# Patient Record
Sex: Female | Born: 1960
Health system: Southern US, Community
[De-identification: ages and names within clinical notes are randomized; demographics above are authoritative.]

## PROBLEM LIST (undated history)

## (undated) DIAGNOSIS — F329 Major depressive disorder, single episode, unspecified: Secondary | ICD-10-CM

## (undated) DIAGNOSIS — F32A Depression, unspecified: Secondary | ICD-10-CM

## (undated) DIAGNOSIS — Z5189 Encounter for other specified aftercare: Secondary | ICD-10-CM

## (undated) DIAGNOSIS — F514 Sleep terrors [night terrors]: Secondary | ICD-10-CM

## (undated) DIAGNOSIS — R55 Syncope and collapse: Secondary | ICD-10-CM

## (undated) DIAGNOSIS — M5020 Other cervical disc displacement, unspecified cervical region: Secondary | ICD-10-CM

## (undated) DIAGNOSIS — R42 Dizziness and giddiness: Secondary | ICD-10-CM

## (undated) DIAGNOSIS — F419 Anxiety disorder, unspecified: Secondary | ICD-10-CM

## (undated) DIAGNOSIS — E119 Type 2 diabetes mellitus without complications: Secondary | ICD-10-CM

## (undated) DIAGNOSIS — K219 Gastro-esophageal reflux disease without esophagitis: Secondary | ICD-10-CM

## (undated) DIAGNOSIS — E559 Vitamin D deficiency, unspecified: Secondary | ICD-10-CM

## (undated) DIAGNOSIS — IMO0001 Reserved for inherently not codable concepts without codable children: Secondary | ICD-10-CM

## (undated) DIAGNOSIS — M1990 Unspecified osteoarthritis, unspecified site: Secondary | ICD-10-CM

## (undated) DIAGNOSIS — J45909 Unspecified asthma, uncomplicated: Secondary | ICD-10-CM

## (undated) DIAGNOSIS — G4730 Sleep apnea, unspecified: Secondary | ICD-10-CM

## (undated) DIAGNOSIS — I10 Essential (primary) hypertension: Secondary | ICD-10-CM

## (undated) DIAGNOSIS — I639 Cerebral infarction, unspecified: Secondary | ICD-10-CM

## (undated) DIAGNOSIS — D649 Anemia, unspecified: Secondary | ICD-10-CM

## (undated) DIAGNOSIS — I209 Angina pectoris, unspecified: Secondary | ICD-10-CM

## (undated) DIAGNOSIS — I509 Heart failure, unspecified: Secondary | ICD-10-CM

## (undated) HISTORY — DX: Major depressive disorder, single episode, unspecified: F32.9

## (undated) HISTORY — DX: Essential (primary) hypertension: I10

## (undated) HISTORY — PX: COLONOSCOPY: SHX174

## (undated) HISTORY — PX: MOUTH SURGERY: SHX715

## (undated) HISTORY — PX: TUBAL LIGATION: SHX77

## (undated) HISTORY — DX: Gastro-esophageal reflux disease without esophagitis: K21.9

## (undated) HISTORY — DX: Sleep apnea, unspecified: G47.30

## (undated) HISTORY — DX: Depression, unspecified: F32.A

## (undated) HISTORY — PX: FOOT SURGERY: SHX648

---

## 2005-04-01 HISTORY — PX: GASTRIC BYPASS: SHX52

## 2008-04-01 DIAGNOSIS — I639 Cerebral infarction, unspecified: Secondary | ICD-10-CM

## 2008-04-01 HISTORY — DX: Cerebral infarction, unspecified: I63.9

## 2012-05-17 ENCOUNTER — Encounter (HOSPITAL_COMMUNITY): Payer: Self-pay | Admitting: Emergency Medicine

## 2012-05-17 ENCOUNTER — Emergency Department (HOSPITAL_COMMUNITY): Payer: Self-pay

## 2012-05-17 ENCOUNTER — Emergency Department (HOSPITAL_COMMUNITY)
Admission: EM | Admit: 2012-05-17 | Discharge: 2012-05-17 | Disposition: A | Discharge status: Home / Self Care | Payer: Self-pay | Attending: Emergency Medicine | Admitting: Emergency Medicine

## 2012-05-17 DIAGNOSIS — Z87891 Personal history of nicotine dependence: Secondary | ICD-10-CM | POA: Insufficient documentation

## 2012-05-17 DIAGNOSIS — Z79899 Other long term (current) drug therapy: Secondary | ICD-10-CM | POA: Insufficient documentation

## 2012-05-17 DIAGNOSIS — R079 Chest pain, unspecified: Secondary | ICD-10-CM | POA: Insufficient documentation

## 2012-05-17 DIAGNOSIS — R142 Eructation: Secondary | ICD-10-CM | POA: Insufficient documentation

## 2012-05-17 DIAGNOSIS — Z9884 Bariatric surgery status: Secondary | ICD-10-CM | POA: Insufficient documentation

## 2012-05-17 DIAGNOSIS — J45909 Unspecified asthma, uncomplicated: Secondary | ICD-10-CM | POA: Insufficient documentation

## 2012-05-17 DIAGNOSIS — Z9889 Other specified postprocedural states: Secondary | ICD-10-CM | POA: Insufficient documentation

## 2012-05-17 DIAGNOSIS — Z8673 Personal history of transient ischemic attack (TIA), and cerebral infarction without residual deficits: Secondary | ICD-10-CM | POA: Insufficient documentation

## 2012-05-17 DIAGNOSIS — F411 Generalized anxiety disorder: Secondary | ICD-10-CM | POA: Insufficient documentation

## 2012-05-17 DIAGNOSIS — R143 Flatulence: Secondary | ICD-10-CM | POA: Insufficient documentation

## 2012-05-17 DIAGNOSIS — Z7982 Long term (current) use of aspirin: Secondary | ICD-10-CM | POA: Insufficient documentation

## 2012-05-17 DIAGNOSIS — R141 Gas pain: Secondary | ICD-10-CM | POA: Insufficient documentation

## 2012-05-17 HISTORY — DX: Encounter for other specified aftercare: Z51.89

## 2012-05-17 HISTORY — DX: Cerebral infarction, unspecified: I63.9

## 2012-05-17 HISTORY — DX: Unspecified asthma, uncomplicated: J45.909

## 2012-05-17 LAB — CBC WITH DIFFERENTIAL/PLATELET
Basophils Absolute: 0 10*3/uL (ref 0.0–0.1)
Basophils Relative: 0 % (ref 0–1)
Eosinophils Absolute: 0 10*3/uL (ref 0.0–0.7)
Eosinophils Relative: 0 % (ref 0–5)
HCT: 39.7 % (ref 36.0–46.0)
Hemoglobin: 13.5 g/dL (ref 12.0–15.0)
Lymphocytes Relative: 8 % — ABNORMAL LOW (ref 12–46)
Lymphs Abs: 0.6 10*3/uL — ABNORMAL LOW (ref 0.7–4.0)
MCH: 27.8 pg (ref 26.0–34.0)
MCHC: 34 g/dL (ref 30.0–36.0)
MCV: 81.9 fL (ref 78.0–100.0)
Monocytes Absolute: 0.6 10*3/uL (ref 0.1–1.0)
Monocytes Relative: 7 % (ref 3–12)
Neutro Abs: 6.6 10*3/uL (ref 1.7–7.7)
Neutrophils Relative %: 85 % — ABNORMAL HIGH (ref 43–77)
Platelets: 214 10*3/uL (ref 150–400)
RBC: 4.85 MIL/uL (ref 3.87–5.11)
RDW: 13.1 % (ref 11.5–15.5)
WBC: 7.8 10*3/uL (ref 4.0–10.5)

## 2012-05-17 LAB — POCT I-STAT, CHEM 8
BUN: 4 mg/dL — ABNORMAL LOW (ref 6–23)
Calcium, Ion: 1.24 mmol/L — ABNORMAL HIGH (ref 1.12–1.23)
Chloride: 109 mEq/L (ref 96–112)
Creatinine, Ser: 0.8 mg/dL (ref 0.50–1.10)
Glucose, Bld: 90 mg/dL (ref 70–99)
HCT: 42 % (ref 36.0–46.0)
Hemoglobin: 14.3 g/dL (ref 12.0–15.0)
Potassium: 3.9 mEq/L (ref 3.5–5.1)
Sodium: 143 mEq/L (ref 135–145)
TCO2: 25 mmol/L (ref 0–100)

## 2012-05-17 LAB — TROPONIN I
Troponin I: <0.3 ng/mL (ref <0.30)
Troponin I: <0.3 ng/mL (ref <0.30)

## 2012-05-17 NOTE — ED Notes (Addendum)
Patient states she ate a large fatty meal with ice cream before bed. Patient states she was gassy before bed. Patient woke up this morning feeling gassy and c/o chest tightness. Patient states she felt like she couldn't breathe, pain 10/10. Patient drank coffee trying to move the "gas bubble" around 0430. Patient just moved to the area. Patient placed on cardiac monitor. Patient requesting something for gas. Patient placed on 2L [place] O2 for comfort.

## 2012-05-17 NOTE — ED Notes (Signed)
Per EMS, patient called 911 initial complaint of chest pain. Upon evaluation by EMS patient area of complaint moved to her abd. Patient walked out of her house and collapsed in the driveway to her knees. Patient c/o shortness of breath with abd pain en route. Patient accompanied by minor child. Patient s/p gastric bypass in 2007, patient ate a large "fatty" meal and ice cream just before bed.

## 2012-05-17 NOTE — ED Provider Notes (Signed)
History     CSN: [id number]  Arrival date & time 05/17/12  2841   First MD Initiated Contact with Patient 05/17/12 0700      Chief Complaint  Patient presents with  . Abdominal Pain    (Consider location/radiation/quality/duration/timing/severity/associated sxs/prior treatment) The history is provided by the patient.   patient presents with chest pain began around 3 in the morning. It is dull and in her mid chest. It went to the left side. It is improved now. No fevers. No cough. She states that her mother recently died and she is not able to deal with the. She states her mother died of a heart attack. Patient is pain-free now. She states that she had a negative heart cath in 2006. The pain is up in her chest but did briefly moved down to her abdomen. She states the pain does go through to her back a little bit. No nausea vomiting. She has a previous gastric bypass. Patient states that she was worried and had to get out of the house because that's where her mother had died. Patient states that she has been belching and it makes her pain improve.  Past Medical History  Diagnosis Date  . Asthma   . Blood transfusion without reported diagnosis   . Stroke 2010    TIA    Past Surgical History  Procedure Laterality Date  . Gastric bypass  2007    History reviewed. No pertinent family history.  History  Substance Use Topics  . Smoking status: Former Games developer  . Smokeless tobacco: Not on file  . Alcohol Use: No     Comment: quit 90 days ago    OB History   Grav Para Term Preterm Abortions TAB SAB Ect Mult Living                  Review of Systems  Constitutional: Negative for activity change and appetite change.  HENT: Negative for neck stiffness.   Eyes: Negative for pain.  Respiratory: Negative for chest tightness and shortness of breath.   Cardiovascular: Positive for chest pain. Negative for leg swelling.  Gastrointestinal: Positive for abdominal pain. Negative for  nausea, vomiting and diarrhea.  Genitourinary: Negative for flank pain.  Musculoskeletal: Negative for back pain.  Skin: Negative for rash.  Neurological: Negative for weakness, numbness and headaches.  Psychiatric/Behavioral: Negative for behavioral problems. The patient is nervous/anxious.     Allergies  Review of patient's allergies indicates no known allergies.  Home Medications   Current Outpatient Rx  Name  Route  Sig  Dispense  Refill  . aspirin 81 MG chewable tablet   Oral   Chew 81 mg by mouth daily.         . Cyanocobalamin (VITAMIN B 12 PO)   Sublingual   Place 1,000 mg under the tongue daily.         Marland Kitchen docusate sodium (COLACE) 100 MG capsule   Oral   Take 100 mg by mouth 2 (two) times daily as needed for constipation. For constipation         . ferrous sulfate 325 (65 FE) MG tablet   Oral   Take 325 mg by mouth 3 (three) times daily with meals.         . folic acid (FOLVITE) 1 MG tablet   Oral   Take 1 mg by mouth daily.           BP 111/54  Pulse 71  Temp(Src) 98.7 F (  37.1 C) (Oral)  Resp 20  SpO2 97%  Physical Exam  Nursing note and vitals reviewed. Constitutional: She is oriented to person, place, and time. She appears well-developed and well-nourished.  HENT:  Head: Normocephalic and atraumatic.  Eyes: EOM are normal. Pupils are equal, round, and reactive to light.  Neck: Normal range of motion. Neck supple.  Cardiovascular: Normal rate, regular rhythm and normal heart sounds.   No murmur heard. Pulmonary/Chest: Effort normal and breath sounds normal. No respiratory distress. She has no wheezes. She has no rales.  Abdominal: Soft. Bowel sounds are normal. She exhibits no distension. There is no tenderness. There is no rebound and no guarding.  Musculoskeletal: Normal range of motion.  Neurological: She is alert and oriented to person, place, and time. No cranial nerve deficit.  Skin: Skin is warm and dry.  Psychiatric: Her speech is  normal.  patietn appears somewhat anxious.     ED Course  Procedures (including critical care time)  Labs Reviewed  CBC WITH DIFFERENTIAL - Abnormal; Notable for the following:    Neutrophils Relative 85 (*)    Lymphocytes Relative 8 (*)    Lymphs Abs 0.6 (*)    All other components within normal limits  POCT I-STAT, CHEM 8 - Abnormal; Notable for the following:    BUN 4 (*)    Calcium, Ion 1.24 (*)    All other components within normal limits  TROPONIN I  TROPONIN I   Dg Chest 2 View  05/17/2012  *RADIOLOGY REPORT*  Clinical Data: Chest pain and shortness of breath.  CHEST - 2 VIEW  Comparison: None  Findings: The cardiomediastinal silhouette is unremarkable. The lungs are clear. There is no evidence of focal airspace disease, pulmonary edema, suspicious pulmonary nodule/mass, pleural effusion, or pneumothorax. No acute bony abnormalities are identified.  IMPRESSION: No evidence of active cardiopulmonary disease.   Original Report Authenticated By: Harmon Pier, M.D.      1. Chest pain      Date: 05/17/2012  Rate: 77  Rhythm: normal sinus rhythm  QRS Axis: normal  Intervals: normal  ST/T Wave abnormalities: normal  Conduction Disutrbances:none  Narrative Interpretation:   Old EKG Reviewed: none available    MDM  Patient with chest pain. may be an anxiety component to. EKG is reassuring. Has had a negative recent heart cath. Pain-free now. No abdominal pain or tenderness. Patient be discharged home. She's had 2 negative cardiac enzymes.        Juliet Rude. Rubin Payor, MD 05/17/12 1536

## 2012-05-17 NOTE — ED Notes (Signed)
[id number]> Expected date:<BR> Expected time:<BR> Means of arrival:<BR> Comments:<BR> EMS, 55 F, Abd Pain

## 2012-05-17 NOTE — ED Notes (Signed)
Pt. Blood drawn was attempted x2 but was unsuccessful. Nurse was notified of pt. labs.

## 2012-05-17 NOTE — ED Notes (Signed)
Pt given sandwich and water. Sts she put her bra and shirt back on over the monitor leads. Sts she is ready to go. Informed pt that she had to wait for last lab result then dr would talk to her about d/c.

## 2013-12-07 ENCOUNTER — Encounter (HOSPITAL_COMMUNITY): Payer: Self-pay | Admitting: Emergency Medicine

## 2013-12-07 ENCOUNTER — Emergency Department (HOSPITAL_COMMUNITY)
Admission: EM | Admit: 2013-12-07 | Discharge: 2013-12-07 | Disposition: A | Discharge status: Home / Self Care | Payer: Medicaid Other | Attending: Emergency Medicine | Admitting: Emergency Medicine

## 2013-12-07 DIAGNOSIS — Z9884 Bariatric surgery status: Secondary | ICD-10-CM | POA: Insufficient documentation

## 2013-12-07 DIAGNOSIS — R079 Chest pain, unspecified: Secondary | ICD-10-CM | POA: Insufficient documentation

## 2013-12-07 DIAGNOSIS — E119 Type 2 diabetes mellitus without complications: Secondary | ICD-10-CM | POA: Insufficient documentation

## 2013-12-07 DIAGNOSIS — Z8673 Personal history of transient ischemic attack (TIA), and cerebral infarction without residual deficits: Secondary | ICD-10-CM | POA: Insufficient documentation

## 2013-12-07 DIAGNOSIS — R5381 Other malaise: Secondary | ICD-10-CM

## 2013-12-07 DIAGNOSIS — R5383 Other fatigue: Secondary | ICD-10-CM | POA: Insufficient documentation

## 2013-12-07 DIAGNOSIS — Z87891 Personal history of nicotine dependence: Secondary | ICD-10-CM | POA: Diagnosis not present

## 2013-12-07 DIAGNOSIS — F10229 Alcohol dependence with intoxication, unspecified: Secondary | ICD-10-CM | POA: Insufficient documentation

## 2013-12-07 DIAGNOSIS — R1013 Epigastric pain: Secondary | ICD-10-CM | POA: Insufficient documentation

## 2013-12-07 DIAGNOSIS — J45901 Unspecified asthma with (acute) exacerbation: Secondary | ICD-10-CM | POA: Diagnosis not present

## 2013-12-07 DIAGNOSIS — F1020 Alcohol dependence, uncomplicated: Secondary | ICD-10-CM

## 2013-12-07 HISTORY — DX: Type 2 diabetes mellitus without complications: E11.9

## 2013-12-07 LAB — CBC WITH DIFFERENTIAL/PLATELET
Basophils Absolute: 0 10*3/uL (ref 0.0–0.1)
Basophils Relative: 0 % (ref 0–1)
Eosinophils Absolute: 0 10*3/uL (ref 0.0–0.7)
Eosinophils Relative: 1 % (ref 0–5)
HCT: 41 % (ref 36.0–46.0)
Hemoglobin: 14 g/dL (ref 12.0–15.0)
Lymphocytes Relative: 22 % (ref 12–46)
Lymphs Abs: 1 10*3/uL (ref 0.7–4.0)
MCH: 27.5 pg (ref 26.0–34.0)
MCHC: 34.1 g/dL (ref 30.0–36.0)
MCV: 80.4 fL (ref 78.0–100.0)
Monocytes Absolute: 0.2 10*3/uL (ref 0.1–1.0)
Monocytes Relative: 5 % (ref 3–12)
Neutro Abs: 3.4 10*3/uL (ref 1.7–7.7)
Neutrophils Relative %: 72 % (ref 43–77)
Platelets: 164 10*3/uL (ref 150–400)
RBC: 5.1 MIL/uL (ref 3.87–5.11)
RDW: 14.2 % (ref 11.5–15.5)
WBC: 4.6 10*3/uL (ref 4.0–10.5)

## 2013-12-07 LAB — RAPID URINE DRUG SCREEN, HOSP PERFORMED
Amphetamines: NOT DETECTED
Barbiturates: NOT DETECTED
Benzodiazepines: NOT DETECTED
Cocaine: NOT DETECTED
Opiates: NOT DETECTED
Tetrahydrocannabinol: NOT DETECTED

## 2013-12-07 LAB — URINALYSIS, ROUTINE W REFLEX MICROSCOPIC
Glucose, UA: NEGATIVE mg/dL
Hgb urine dipstick: NEGATIVE
Ketones, ur: 40 mg/dL — AB
Nitrite: NEGATIVE
Protein, ur: NEGATIVE mg/dL
Specific Gravity, Urine: 1.015 (ref 1.005–1.030)
Urobilinogen, UA: 1 mg/dL (ref 0.0–1.0)
pH: 6 (ref 5.0–8.0)

## 2013-12-07 LAB — COMPREHENSIVE METABOLIC PANEL
ALT: 13 U/L (ref 0–35)
AST: 22 U/L (ref 0–37)
Albumin: 3.8 g/dL (ref 3.5–5.2)
Alkaline Phosphatase: 82 U/L (ref 39–117)
Anion gap: 14 (ref 5–15)
BUN: 7 mg/dL (ref 6–23)
CO2: 24 mEq/L (ref 19–32)
Calcium: 9.3 mg/dL (ref 8.4–10.5)
Chloride: 99 mEq/L (ref 96–112)
Creatinine, Ser: 0.66 mg/dL (ref 0.50–1.10)
GFR calc Af Amer: >90 mL/min (ref >90)
GFR calc non Af Amer: >90 mL/min (ref >90)
Glucose, Bld: 102 mg/dL — ABNORMAL HIGH (ref 70–99)
Potassium: 4.4 mEq/L (ref 3.7–5.3)
Sodium: 137 mEq/L (ref 137–147)
Total Bilirubin: 1.1 mg/dL (ref 0.3–1.2)
Total Protein: 7.3 g/dL (ref 6.0–8.3)

## 2013-12-07 LAB — ETHANOL: Alcohol, Ethyl (B): <11 mg/dL (ref 0–11)

## 2013-12-07 LAB — URINE MICROSCOPIC-ADD ON

## 2013-12-07 LAB — LIPASE, BLOOD: Lipase: 40 U/L (ref 11–59)

## 2013-12-07 MED ORDER — SODIUM CHLORIDE 0.9 % IV SOLN
INTRAVENOUS | Status: DC
Start: 2013-12-07 — End: 2013-12-07
  Administered 2013-12-07: 09:00:00 via INTRAVENOUS

## 2013-12-07 NOTE — ED Notes (Signed)
Dr. Wentz at bedside. 

## 2013-12-07 NOTE — ED Notes (Signed)
Cp and sob started yesterday  Has had some nausea states had gastric bypass in 07 and that it took care of her diabetes states she has not followed up has been drinking a lot of etoh she states

## 2013-12-07 NOTE — ED Notes (Signed)
Case Manager at bedside

## 2013-12-07 NOTE — ED Notes (Signed)
Phlebotomy notified of a redraw of the CBC with differential due to clotting in the main lab.

## 2013-12-07 NOTE — ED Notes (Signed)
Social worker to speak to pt prior to discharge.

## 2013-12-07 NOTE — Discharge Instructions (Signed)
Avoid all forms of alcohol. Followup for treatment and counseling with the facility or organization that can help you with alcohol abuse. Try to get plenty of rest, eat and drink regularly. Consider following up with a primary care doctor for further evaluation and treatment within 2 or 3 weeks.     Alcohol Use Disorder Alcohol use disorder is a mental disorder. It is not a one-time incident of heavy drinking. Alcohol use disorder is the excessive and uncontrollable use of alcohol over time that leads to problems with functioning in one or more areas of daily living. People with this disorder risk harming themselves and others when they drink to excess. Alcohol use disorder also can cause other mental disorders, such as mood and anxiety disorders, and serious physical problems. People with alcohol use disorder often misuse other drugs.  Alcohol use disorder is common and widespread. Some people with this disorder drink alcohol to cope with or escape from negative life events. Others drink to relieve chronic pain or symptoms of mental illness. People with a family history of alcohol use disorder are at higher risk of losing control and using alcohol to excess.  SYMPTOMS  Signs and symptoms of alcohol use disorder may include the following:   Consumption ofalcohol inlarger amounts or over a longer period of time than intended.  Multiple unsuccessful attempts to cutdown or control alcohol use.   A great deal of time spent obtaining alcohol, using alcohol, or recovering from the effects of alcohol (hangover).  A strong desire or urge to use alcohol (cravings).   Continued use of alcohol despite problems at work, school, or home because of alcohol use.   Continued use of alcohol despite problems in relationships because of alcohol use.  Continued use of alcohol in situations when it is physically hazardous, such as driving a car.  Continued use of alcohol despite awareness of a  physical or psychological problem that is likely related to alcohol use. Physical problems related to alcohol use can involve the brain, heart, liver, stomach, and intestines. Psychological problems related to alcohol use include intoxication, depression, anxiety, psychosis, delirium, and dementia.   The need for increased amounts of alcohol to achieve the same desired effect, or a decreased effect from the consumption of the same amount of alcohol (tolerance).  Withdrawal symptoms upon reducing or stopping alcohol use, or alcohol use to reduce or avoid withdrawal symptoms. Withdrawal symptoms include:  Racing heart.  Hand tremor.  Difficulty sleeping.  Nausea.  Vomiting.  Hallucinations.  Restlessness.  Seizures. DIAGNOSIS Alcohol use disorder is diagnosed through an assessment by your health care provider. Your health care provider may start by asking three or four questions to screen for excessive or problematic alcohol use. To confirm a diagnosis of alcohol use disorder, at least two symptoms must be present within a 41-month period. The severity of alcohol use disorder depends on the number of symptoms:  Mild--two or three.  Moderate--four or five.  Severe--six or more. Your health care provider may perform a physical exam or use results from lab tests to see if you have physical problems resulting from alcohol use. Your health care provider may refer you to a mental health professional for evaluation. TREATMENT  Some people with alcohol use disorder are able to reduce their alcohol use to low-risk levels. Some people with alcohol use disorder need to quit drinking alcohol. When necessary, mental health professionals with specialized training in substance use treatment can help. Your health care provider can  help you decide how severe your alcohol use disorder is and what type of treatment you need. The following forms of treatment are available:   Detoxification.  Detoxification involves the use of prescription medicines to prevent alcohol withdrawal symptoms in the first week after quitting. This is important for people with a history of symptoms of withdrawal and for heavy drinkers who are likely to have withdrawal symptoms. Alcohol withdrawal can be dangerous and, in severe cases, cause death. Detoxification is usually provided in a hospital or in-patient substance use treatment facility.  Counseling or talk therapy. Talk therapy is provided by substance use treatment counselors. It addresses the reasons people use alcohol and ways to keep them from drinking again. The goals of talk therapy are to help people with alcohol use disorder find healthy activities and ways to cope with life stress, to identify and avoid triggers for alcohol use, and to handle cravings, which can cause relapse.  Medicines.Different medicines can help treat alcohol use disorder through the following actions:  Decrease alcohol cravings.  Decrease the positive reward response felt from alcohol use.  Produce an uncomfortable physical reaction when alcohol is used (aversion therapy).  Support groups. Support groups are run by people who have quit drinking. They provide emotional support, advice, and guidance. These forms of treatment are often combined. Some people with alcohol use disorder benefit from intensive combination treatment provided by specialized substance use treatment centers. Both inpatient and outpatient treatment programs are available. Document Released: 04/25/2004 Document Revised: 08/02/2013 Document Reviewed: 06/25/2012 [facility] Patient Information 2015 [place], [place]. This information is not intended to replace advice given to you by your health care provider. Make sure you discuss any questions you have with your health care provider.  Alcohol and Nutrition Nutrition serves two purposes. It provides energy. It also maintains body structure and function. Food  supplies energy. It also provides the building blocks needed to replace worn or damaged cells. Alcoholics often eat poorly. This limits their supply of essential nutrients. This affects energy supply and structure maintenance. Alcohol also affects the body's nutrients in:  Digestion.  Storage.  Using and getting rid of waste products. IMPAIRMENT OF NUTRIENT DIGESTION AND UTILIZATION   Once ingested, food must be broken down into small components (digested). Then it is available for energy. It helps maintain body structure and function. Digestion begins in the mouth. It continues in the stomach and intestines, with help from the pancreas. The nutrients from digested food are absorbed from the intestines into the blood. Then they are carried to the liver. The liver prepares nutrients for:  Immediate use.  Storage and future use.  Alcohol inhibits the breakdown of nutrients into usable molecules.  It decreases secretion of digestive enzymes from the pancreas.  Alcohol impairs nutrient absorption by damaging the cells lining the stomach and intestines.  It also interferes with moving some nutrients into the blood.  In addition, nutritional deficiencies themselves may lead to further absorption problems.  For example, folate deficiency changes the cells that line the small intestine. This impairs how water is absorbed. It also affects absorbed nutrients. These include glucose, sodium, and additional folate.  Even if nutrients are digested and absorbed, alcohol can prevent them from being fully used. It changes their transport, storage, and excretion. Impaired utilization of nutrients by alcoholics is indicated by:  Decreased liver stores of vitamins, such as vitamin A.  Increased excretion of nutrients such as fat. ALCOHOL AND ENERGY SUPPLY   Three basic nutritional components found  in food are:  Carbohydrates.  Proteins.  Fats.  These are used as energy. Some alcoholics take in  as much as 50% of their total daily calories from alcohol. They often neglect important foods.  Even when enough food is eaten, alcohol can impair the ways the body controls blood sugar (glucose) levels. It may either increase or decrease blood sugar.  In non-diabetic alcoholics, increased blood sugar (hyperglycemia) is caused by poor insulin secretion. It is usually temporary.  Decreased blood sugar (hypoglycemia) can cause serious injury even if this condition is short-lived. Low blood sugar can happen when a fasting or malnourished person drinks alcohol. When there is no food to supply energy, stored sugar is used up. The products of alcohol inhibit forming glucose from other compounds such as amino acids. As a result, alcohol causes the brain and other body tissue to lack glucose. It is needed for energy and function.  Alcohol is an energy source. But how the body processes and uses the energy from alcohol is complex. Also, when alcohol is substituted for carbohydrates, subjects tend to lose weight. This indicates that they get less energy from alcohol than from food. ALCOHOL - MAINTAINING CELL STRUCTURE AND FUNCTION  Structure Cells are made mostly of protein. So an adequate protein diet is important for maintaining cell structure. This is especially true if cells are being damaged. Research indicates that alcohol affects protein nutrition by causing impaired:  Digestion of proteins to amino acids.  Processing of amino acids by the small intestine and liver.  Synthesis of proteins from amino acids.  Protein secretion by the liver. Function Nutrients are essential for the body to function well. They provide the tools that the body needs to work well:   Proteins.  Vitamins.  Minerals. Alcohol can disrupt body function. It may cause nutrient deficiencies. And it may interfere with the way nutrients are processed. Vitamins  Vitamins are essential to maintain growth and normal  metabolism. They regulate many of the body`s processes. Chronic heavy drinking causes deficiencies in many vitamins. This is caused by eating less. And, in some cases, vitamins may be poorly absorbed. For example, alcohol inhibits fat absorption. It impairs how the vitamins A, E, and D are normally absorbed along with dietary fats. Not enough vitamin A may cause night blindness. Not enough vitamin D may cause softening of the bones.  Some alcoholics lack vitamins A, C, D, E, K, and the B vitamins. These are all involved in wound healing and cell maintenance. In particular, because vitamin K is necessary for blood clotting, lacking that vitamin can cause delayed clotting. The result is excess bleeding. Lacking other vitamins involved in brain function may cause severe neurological damage. Minerals Deficiencies of minerals such as calcium, magnesium, iron, and zinc are common in alcoholics. The alcohol itself does not seem to affect how these minerals are absorbed. Rather, they seem to occur secondary to other alcohol-related problems, such as:  Less calcium absorbed.  Not enough magnesium.  More urinary excretion.  Vomiting.  Diarrhea.  Not enough iron due to gastrointestinal bleeding.  Not enough zinc or losses related to other nutrient deficiencies.  Mineral deficiencies can cause a variety of medical consequences. These range from calcium-related bone disease to zinc-related night blindness and skin lesions. ALCOHOL, MALNUTRITION, AND MEDICAL COMPLICATIONS  Liver Disease   Alcoholic liver damage is caused primarily by alcohol itself. But poor nutrition may increase the risk of alcohol-related liver damage. For example, nutrients normally found in the liver  are known to be affected by drinking alcohol. These include carotenoids, which are the major sources of vitamin A, and vitamin E compounds. Decreases in such nutrients may play some role in alcohol-related liver  damage. Pancreatitis  Research suggests that malnutrition may increase the risk of developing alcoholic pancreatitis. Research suggests that a diet lacking in protein may increase alcohol's damaging effect on the pancreas. Brain  Nutritional deficiencies may have severe effects on brain function. These may be permanent. Specifically, thiamine deficiencies are often seen in alcoholics. They can cause severe neurological problems. These include:  Impaired movement.  Memory loss seen in Wernicke-Korsakoff syndrome. Pregnancy  Alcohol has toxic effects on fetal development. It causes alcohol-related birth defects. They include fetal alcohol syndrome. Alcohol itself is toxic to the fetus. Also, the nutritional deficiency can affect how the fetus develops. That may compound the risk of developmental damage.  Nutritional needs during pregnancy are 10% to 30% greater than normal. Food intake can increase by as much as 140% to cover the needs of both mother and fetus. An alcoholic mother`s nutritional problems may adversely affect the nutrition of the fetus. And alcohol itself can also restrict nutrition flow to the fetus. NUTRITIONAL STATUS OF ALCOHOLICS  Techniques for assessing nutritional status include:  Taking body measurements to estimate fat reserves. They include:  Weight.  Height.  Mass.  Skin fold thickness.  Performing blood analysis to provide measurements of circulating:  Proteins.  Vitamins.  Minerals.  These techniques tend to be imprecise. For many nutrients, there is no clear "cut-off" point that would allow an accurate definition of deficiency. So assessing the nutritional status of alcoholics is limited by these techniques. Dietary status may provide information about the risk of developing nutritional problems. Dietary status is assessed by:  Taking patients' dietary histories.  Evaluating the amount and types of food they are eating.  It is difficult to  determine what exact amount of alcohol begins to have damaging effects on nutrition. In general, moderate drinkers have 2 drinks or less per day. They seem to be at little risk for nutritional problems. Various medical disorders begin to appear at greater levels.  Research indicates that the majority of even the heaviest drinkers have few obvious nutritional deficiencies. Many alcoholics who are hospitalized for medical complications of their disease do have severe malnutrition. Alcoholics tend to eat poorly. Often they eat less than the amounts of food necessary to provide enough:  Carbohydrates.  Protein.  Fat.  Vitamins A and C.  B vitamins.  Minerals like calcium and iron. Of major concern is alcohol's effect on digesting food and use of nutrients. It may shift a mildly malnourished person toward severe malnutrition. Document Released: 01/10/2005 Document Revised: 06/10/2011 Document Reviewed: 06/26/2005 [facility] Patient Information 2015 [place], [place]. This information is not intended to replace advice given to you by your health care provider. Make sure you discuss any questions you have with your health care provider.  Fatigue Fatigue is a feeling of tiredness, lack of energy, lack of motivation, or feeling tired all the time. Having enough rest, good nutrition, and reducing stress will normally reduce fatigue. Consult your caregiver if it persists. The nature of your fatigue will help your caregiver to find out its cause. The treatment is based on the cause.  CAUSES  There are many causes for fatigue. Most of the time, fatigue can be traced to one or more of your habits or routines. Most causes fit into one or more of three general  areas. They are: Lifestyle problems  Sleep disturbances.  Overwork.  Physical exertion.  Unhealthy habits.  Poor eating habits or eating disorders.  Alcohol and/or drug use .  Lack of proper nutrition (malnutrition). Psychological  problems  Stress and/or anxiety problems.  Depression.  Grief.  Boredom. Medical Problems or Conditions  Anemia.  Pregnancy.  Thyroid gland problems.  Recovery from major surgery.  Continuous pain.  Emphysema or asthma that is not well controlled  Allergic conditions.  Diabetes.  Infections (such as mononucleosis).  Obesity.  Sleep disorders, such as sleep apnea.  Heart failure or other heart-related problems.  Cancer.  Kidney disease.  Liver disease.  Effects of certain medicines such as antihistamines, cough and cold remedies, prescription pain medicines, heart and blood pressure medicines, drugs used for treatment of cancer, and some antidepressants. SYMPTOMS  The symptoms of fatigue include:   Lack of energy.  Lack of drive (motivation).  Drowsiness.  Feeling of indifference to the surroundings. DIAGNOSIS  The details of how you feel help guide your caregiver in finding out what is causing the fatigue. You will be asked about your present and past health condition. It is important to review all medicines that you take, including prescription and non-prescription items. A thorough exam will be done. You will be questioned about your feelings, habits, and normal lifestyle. Your caregiver may suggest blood tests, urine tests, or other tests to look for common medical causes of fatigue.  TREATMENT  Fatigue is treated by correcting the underlying cause. For example, if you have continuous pain or depression, treating these causes will improve how you feel. Similarly, adjusting the dose of certain medicines will help in reducing fatigue.  HOME CARE INSTRUCTIONS   Try to get the required amount of good sleep every night.  Eat a healthy and nutritious diet, and drink enough water throughout the day.  Practice ways of relaxing (including yoga or meditation).  Exercise regularly.  Make plans to change situations that cause stress. Act on those plans so that  stresses decrease over time. Keep your work and personal routine reasonable.  Avoid street drugs and minimize use of alcohol.  Start taking a daily multivitamin after consulting your caregiver. SEEK MEDICAL CARE IF:   You have persistent tiredness, which cannot be accounted for.  You have fever.  You have unintentional weight loss.  You have headaches.  You have disturbed sleep throughout the night.  You are feeling sad.  You have constipation.  You have dry skin.  You have gained weight.  You are taking any new or different medicines that you suspect are causing fatigue.  You are unable to sleep at night.  You develop any unusual swelling of your legs or other parts of your body. SEEK IMMEDIATE MEDICAL CARE IF:   You are feeling confused.  Your vision is blurred.  You feel faint or pass out.  You develop severe headache.  You develop severe abdominal, pelvic, or back pain.  You develop chest pain, shortness of breath, or an irregular or fast heartbeat.  You are unable to pass a normal amount of urine.  You develop abnormal bleeding such as bleeding from the rectum or you vomit blood.  You have thoughts about harming yourself or committing suicide.  You are worried that you might harm someone else. MAKE SURE YOU:   Understand these instructions.  Will watch your condition.  Will get help right away if you are not doing well or get worse. Document Released: 01/13/2007  Document Revised: 06/10/2011 Document Reviewed: 07/20/2013 [facility] Patient Information 2015 [place], [place]. This information is not intended to replace advice given to you by your health care provider. Make sure you discuss any questions you have with your health care provider.    Emergency Department Resource Guide 1) Find a Doctor and Pay Out of Pocket Although you won't have to find out who is covered by your insurance plan, it is a good idea to ask around and get recommendations. You  will then need to call the office and see if the doctor you have chosen will accept you as a new patient and what types of options they offer for patients who are self-pay. Some doctors offer discounts or will set up payment plans for their patients who do not have insurance, but you will need to ask so you aren't surprised when you get to your appointment.  2) Contact Your Local Health Department Not all health departments have doctors that can see patients for sick visits, but many do, so it is worth a call to see if yours does. If you don't know where your local health department is, you can check in your phone book. The CDC also has a tool to help you locate your state's health department, and many state websites also have listings of all of their local health departments.  3) Find a Walk-in Clinic If your illness is not likely to be very severe or complicated, you may want to try a walk in clinic. These are popping up all over the country in pharmacies, drugstores, and shopping centers. They're usually staffed by nurse practitioners or physician assistants that have been trained to treat common illnesses and complaints. They're usually fairly quick and inexpensive. However, if you have serious medical issues or chronic medical problems, these are probably not your best option.  No Primary Care Doctor: - Call [facility] at  [phone] - they can help you locate a primary care doctor that  accepts your insurance, provides certain services, etc. - Physician Referral Service- [phone]  Chronic Pain Problems: Organization         Address  Phone   Notes  [facility]  [phone] Patients need to be referred by their primary care doctor.   Medication Assistance: Organization         Address  Phone   Notes  [facility] Medication [facility] [address] [place] [place], [place] [address] [phone] --Must be a resident of [facility] -- Must  have NO insurance coverage whatsoever (no Medicaid/ Medicare, etc.) -- The pt. MUST have a primary care doctor that directs their care regularly and follows them in the community   [facility]  [phone]   [employer]  [phone]    Agencies that provide inexpensive medical care: Organization         Address  Phone   Notes  [facility]  [phone]   [facility]    [phone]   [facility] [address] [place], [place] [address] [phone]   [facility] [address] [place] [phone]   [facility]    [phone]   [facility]    [phone]   [facility]  [address], [place] Phone:  [phone], Fax:  [phone] Hours of Operation:  9 am - 6 pm, M-F.  Also  accepts Medicaid/Medicare and self-pay.  [facility] for [place] [address], [address], [place] Phone: [phone], Fax: [phone]. Hours of Operation:  8:30 am - 5:30 pm, M-F.  Also accepts Medicaid and self-pay.  [facility] [place] [address], [place] Phone: [phone]   [place], [place], [place] [phone], Ext. [phone] Mondays & Thursdays: 7-9 AM.  First 15 patients are seen on a first come, first serve basis.    [place] Providers:  Organization         Address  Phone   Notes  [facility] [address], [place] [phone] Also accepts self-pay patients.  [facility] [address], [place]  [phone]   [place], [address], [place] [phone]   [facility] [address] [place] [phone]   Lucianne Lei [address], [place]   [phone] Only accepts [place] [facility] [place] patients after  they have their name applied to their card.   Self-Pay (no insurance) in [facility]:  Organization         Address  Phone   Notes  Sickle Cell Patients, [facility] [place] [phone]   [facility] [place] [phone]   [facility] [place]  [place], [place], [place] [phone]   [facility]/Dr. Osei-Bonsu  [address] [place] or [address], [place] [phone] Phone number for both [place] and [place] locations is the same.  [facility] [address], [place] [phone]   [facility] [address] [place] or [address] [phone]   [facility] [address], [place] [phone], phone; [phone], fax Sees patients 1st and 3rd Saturday of every month.  Must not qualify for public or private insurance (i.e. Medicaid, Medicare, [place] [facility] Choice, Veterans' Benefits)  Household income should be no more than 200% of the poverty level The clinic cannot treat you if you are pregnant or think you are pregnant  Sexually transmitted diseases are not treated at the clinic.    Dental Care: Organization         Address  Phone  Notes  [facility] Department of [facility] [phone] Accepts children up to age 48 who are enrolled in [place] or [place]; pregnant women with a Medicaid card; and children who have applied for Medicaid or [place] Health Choice, but were declined, whose parents can pay a reduced fee at time of service.  [facility] Department of [facility]  [address], [place] [phone] Accepts children up to age 7 who are enrolled in [place] or [place]; pregnant women with a Medicaid card; and children who  have applied for Medicaid or [place] Health Choice, but were declined, whose parents can pay a reduced fee at time of service.  [place] [facility] PRO[facility]  [place] [phone] Patients are seen by appointment only. Walk-ins are not accepted. [place] will see patients 65 years of age and older. Monday - Tuesday (8am-5pm)  Most Wednesdays (8:30-5pm) $30 per visit, cash only  [facility] Adult [employer] PROGRAM  [address], [facility] [phone] Patients are seen by appointment only. Walk-ins are not accepted. [place] will see patients 15 years of age and older. One Wednesday Evening (Monthly: Volunteer Based).  $30 per visit, cash only  [place]  [phone] for adults; Children under age 67, call Graduate Pediatric Dentistry at [phone]. Children aged 66-14, please call [phone] to request a pediatric application.  Dental services are provided in all areas of dental care including fillings, crowns and bridges, complete and partial dentures, implants, gum treatment, root canals, and extractions. Preventive care is also provided. Treatment is provided to both adults and children. Patients are selected via a lottery and there is often a waiting list.   [facility] [address], [place]  [phone] [email or web address]   [facility] [address] [place], [place] [phone], Ext. [phone] Second and Fourth Thursday of each month, opens at 6:30 AM; Clinic ends at 9 AM.  Patients are seen on a first-come first-served basis, and a limited number are seen during each clinic.   [facility]  [address] Hillard Danker [place], [place] [phone]   Eligibility Requirements You must have lived in [place], [place], or [place] counties for at least the last three months.   You cannot be eligible for state or federal sponsored [employer], including [employer], [place], or [employer].   You generally cannot be eligible for healthcare insurance through your employer.    How to apply: Eligibility screenings are held every Tuesday and Wednesday afternoon from 1:00 pm until 4:00 pm. You do not need an appointment for the interview!  [facility] [address], [place], [place]   [place]  [place]  [place]  [phone]    Behavioral Health Resources in the Community: Intensive Outpatient Programs Organization         Address  Phone  Notes  [place]. [address], [place], [place] [phone]   [facility] [address], [place], [place]   ADS: [facility] [address] [place], [place]   [place] [address],  [place], [place] or [phone]   Substance Abuse Resources Organization         Address  Phone  Notes  [facility] Services  [phone]   [place]  [phone]   [place]   Chinita Pester  [phone]   Residential & Outpatient Substance Abuse Program  [phone]   Psychological Services Organization         Address  Phone  Notes  [facility] [place]  [place]  [phone]   [place] [address], [place] or [phone]    Mobile Crisis Teams Organization         Address  Phone  Notes  [facility], Mobile Crisis Care Unit  [phone]   [facility]  [address] [place], [place]   [place] [address] [place] [phone]    Self-Help/Support Groups Organization         Address  Phone  Notes  [facility] [place] -  variety of support groups  336- [phone] Call for more information  Narcotics Anonymous (NA), [facility] [address], [employer] [place]  2 meetings at this location   Statistician         Address  Phone  Notes  [facility] [address],    [place]  [phone]   [facility]  [address], [place] [address], [place], [place] [phone]   [facility] [address] [place], [place] [phone] Admissions: 8am-3pm M-F  [facility] [address],    [place], [place] [phone]   [facility] [address] [place], [place], [place] [phone]   [facility] [address],  [place], [place] [phone]   [facility] - Intensive Outpatient [address], [place], [place] [phone]   [facility] (Addiction Recovery [facility] [address] [place].,  [place], [place] [phone] or [phone]   [facility] ([facility]) [address], [place], [place] [phone] Accepts Medicaid  [facility] [place] [address],  [place] [phone] Substance Abuse/Addiction Treatment   [facility] Organization         Address  Phone  Notes  [facility]  [phone]   Angie Fava, PhD [address] Ervin Knack [place], [place]   [phone] or [phone]   [facility] Behavioral   [address] [place], [place] [phone]   [facility] [address] [place], [place] [phone] Insurance/Medicaid/sponsorship through [facility] and Families [address]                                    [place], [place] [phone] Therapy/tele-psych/case  [facility] [address], [place] [phone]    Dr. Lolly Mustache  [phone]   [facility]  [employer] [facility] Dept. 1) [address] [place] 2) [address], [place] 3)  [address], [place] [phone]  [phone]   [facility] Child Abuse Hotline [phone] or [phone] (After Hours)

## 2013-12-07 NOTE — ED Provider Notes (Signed)
CSN: [id number]     Arrival date & time 12/07/13  0804 History   First MD Initiated Contact with Patient 12/07/13 [id number]     Chief Complaint  Patient presents with  . Chest Pain  . Shortness of Breath     (Consider location/radiation/quality/duration/timing/severity/associated sxs/prior Treatment) HPI  [name] [name] is a 53 y.o. female presents for evaluation of chest pain, which started yesterday. The chest pain has been persistent. She was able to sleep some but woke up and noticed that it was there, several times. The chest pain is persistent at 5/10. It is characterized as "sharp". There is no associated diaphoresis, shortness of breath, abdominal pain, back, pain, weakness, or dizziness. She has never had this previously. She additionally feels that she needs help to stop drinking. She works as a Investment banker, corporate and is able to stay sober during the day but drinks a pint of liquor every night. She drove here today to be evaluated. She's never been to AA or gotten therapy for alcoholism. She denies history of delirium tremens-type symptoms. No are now other known modifying factors.   Past Medical History  Diagnosis Date  . Asthma   . Blood transfusion without reported diagnosis   . Stroke 2010    TIA  . Diabetes mellitus without complication    Past Surgical History  Procedure Laterality Date  . Gastric bypass  2007   No family history on file. History  Substance Use Topics  . Smoking status: Former Games developer  . Smokeless tobacco: Not on file  . Alcohol Use: No     Comment: quit 90 days ago   OB History   Grav Para Term Preterm Abortions TAB SAB Ect Mult Living                 Review of Systems  All other systems reviewed and are negative.     Allergies  Review of patient's allergies indicates no known allergies.  Home Medications   Prior to Admission medications   Not on File   BP 151/72  Pulse 71  Temp(Src) 98.7 F (37.1 C) (Oral)  Resp 22  SpO2  97% Physical Exam  Nursing note and vitals reviewed. Constitutional: She is oriented to person, place, and time. She appears well-developed and well-nourished.  HENT:  Head: Normocephalic and atraumatic.  Eyes: Conjunctivae and EOM are normal. Pupils are equal, round, and reactive to light.  Neck: Normal range of motion and phonation normal. Neck supple.  Cardiovascular: Normal rate, regular rhythm and intact distal pulses.   Pulmonary/Chest: Effort normal and breath sounds normal. She exhibits tenderness (Mid-chest, mild).  Abdominal: Soft. She exhibits no distension. There is tenderness (Epigastric, mild). There is no guarding.  Musculoskeletal: Normal range of motion.  Neurological: She is alert and oriented to person, place, and time. She exhibits normal muscle tone.  No dysarthria, dysphasia, or nystagmus  Skin: Skin is warm and dry.  Psychiatric: She has a normal mood and affect. Her behavior is normal. Judgment and thought content normal.    ED Course  Procedures (including critical care time) Medications  0.9 %  sodium chloride infusion ( Intravenous New Bag/Given 12/07/13 0904)    Patient Vitals for the past 24 hrs:  BP Temp Temp src Pulse Resp SpO2  12/07/13 1130 151/72 mmHg - - 71 22 97 %  12/07/13 1115 144/80 mmHg - - 73 21 97 %  12/07/13 1100 128/81 mmHg - - 73 19 97 %  12/07/13 1045 141/90 mmHg - -  82 20 96 %  12/07/13 1030 136/90 mmHg - - 69 21 96 %  12/07/13 1015 140/84 mmHg - - 72 17 97 %  12/07/13 1000 135/90 mmHg - - 69 17 97 %  12/07/13 0945 145/91 mmHg - - 66 19 97 %  12/07/13 0932 147/75 mmHg - - 71 18 97 %  12/07/13 0930 147/75 mmHg - - 70 23 100 %  12/07/13 0915 154/88 mmHg - - 71 20 100 %  12/07/13 0830 145/93 mmHg - - 71 16 100 %  12/07/13 0812 146/104 mmHg 98.7 F (37.1 C) Oral 88 - 99 %    11:28 AM Reevaluation with update and discussion. After initial assessment and treatment, an updated evaluation reveals she remains comfortable. She agrees to  seek help for alcoholism. The care manager will give her outpatient followup recommendations . Luane Rochon L    Labs Review Labs Reviewed  COMPREHENSIVE METABOLIC PANEL - Abnormal; Notable for the following:    Glucose, Bld 102 (*)    All other components within normal limits  URINALYSIS, ROUTINE W REFLEX MICROSCOPIC - Abnormal; Notable for the following:    Color, Urine AMBER (*)    APPearance CLOUDY (*)    Bilirubin Urine SMALL (*)    Ketones, ur 40 (*)    Leukocytes, UA MODERATE (*)    All other components within normal limits  URINE MICROSCOPIC-ADD ON - Abnormal; Notable for the following:    Squamous Epithelial / LPF MANY (*)    Bacteria, UA MANY (*)    All other components within normal limits  LIPASE, BLOOD  URINE RAPID DRUG SCREEN (HOSP PERFORMED)  ETHANOL  CBC WITH DIFFERENTIAL  CBC WITH DIFFERENTIAL    Imaging Review No results found.   EKG Interpretation   Date/Time:  Tuesday December 07 2013 08:09:11 EDT Ventricular Rate:  82 PR Interval:  138 QRS Duration: 72 QT Interval:  392 QTC Calculation: 457 R Axis:   54 Text Interpretation:  Sinus rhythm with frequent Premature ventricular  complexes Septal infarct , age undetermined Abnormal ECG Since last  tracing there has been a change in anterior forces, nonspecific; and PVCs  are new Confirmed by Effie Shy  MD, Janie Capp [phone]) on 12/07/2013 8:51:16 AM      MDM   Final diagnoses:  Malaise  Alcoholism    Malaise with nonspecific complaints, and apparent alcoholism. Doubt serious bacterial infection. Metabolic instability, or impending vascular collapse.  Nursing Notes Reviewed/ Care Coordinated Applicable Imaging Reviewed Interpretation of Laboratory Data incorporated into ED treatment  The patient appears reasonably screened and/or stabilized for discharge and I doubt any other medical condition or other [facility] requiring further screening, evaluation, or treatment in the ED at this time prior to  discharge.  Plan: Home Medications- usual; Home Treatments- rest, avoid EtOH; return here if the recommended treatment, does not improve the symptoms; Recommended follow up- PCP 2-3 weeks and counseling/treatment for alcoholism as an OP    Flint Melter, MD 12/07/13 1208

## 2013-12-07 NOTE — Progress Notes (Signed)
  CARE MANAGEMENT ED NOTE 12/07/2013  Patient:  [name] [name], [name] [name]   Account Number:  [id number]  Date Initiated:  12/07/2013  Documentation initiated by:  Ferdinand Cava  Subjective/Objective Assessment:   53 yo female presenting to the ED with CP     Subjective/Objective Assessment Detail:     Action/Plan:   The patient will follow up with out patient resources to ETOH treatment for counseling   Action/Plan Detail:   Anticipated DC Date:       Status Recommendation to Physician:   Result of Recommendation:  Agreed    DC Planning Services  CM consult  Other    Choice offered to / List presented to:  C-1 Patient          Status of service:  Completed, signed off  ED Comments:   ED Comments Detail:  CM was consulted to assist with out patient resources for ETOH detox. This RN provided the patient with a list of outpatient and inpatient resources for treatment options and counseling. This CM spoke with the patient about contacting the different resources listed and encouraged her to find out which resources were covered by her insurance plan prior to moving forward so the treatment option is affordable. Also discussed Alcoholics Anonymous and discussed using the internet to look up listing of meeting places and times. This CM wrote the AA information on the treatment resource list provided. This CM offered to contact the LCSW on call if any other questions or resources were needed and the patient declined this offer. The patient verbalized understanding of information provided and had no other questions or concerns.

## 2013-12-07 NOTE — ED Notes (Signed)
Pt states she drank three bottles of beer yesterday and she has been drinking a lot.  Pt states the chest pain is in the central chest with no radiation to either arm, back or neck.

## 2014-03-05 ENCOUNTER — Other Ambulatory Visit: Payer: Self-pay

## 2014-03-05 ENCOUNTER — Emergency Department (HOSPITAL_COMMUNITY)
Admission: EM | Admit: 2014-03-05 | Discharge: 2014-03-05 | Disposition: A | Discharge status: Home / Self Care | Payer: Medicaid Other | Attending: Emergency Medicine | Admitting: Emergency Medicine

## 2014-03-05 ENCOUNTER — Emergency Department (HOSPITAL_COMMUNITY): Payer: Medicaid Other

## 2014-03-05 ENCOUNTER — Encounter (HOSPITAL_COMMUNITY): Payer: Self-pay | Admitting: *Deleted

## 2014-03-05 DIAGNOSIS — R0602 Shortness of breath: Secondary | ICD-10-CM

## 2014-03-05 DIAGNOSIS — E119 Type 2 diabetes mellitus without complications: Secondary | ICD-10-CM | POA: Insufficient documentation

## 2014-03-05 DIAGNOSIS — Z79899 Other long term (current) drug therapy: Secondary | ICD-10-CM | POA: Diagnosis not present

## 2014-03-05 DIAGNOSIS — Z8673 Personal history of transient ischemic attack (TIA), and cerebral infarction without residual deficits: Secondary | ICD-10-CM | POA: Insufficient documentation

## 2014-03-05 DIAGNOSIS — Z87891 Personal history of nicotine dependence: Secondary | ICD-10-CM | POA: Insufficient documentation

## 2014-03-05 DIAGNOSIS — R002 Palpitations: Secondary | ICD-10-CM | POA: Diagnosis not present

## 2014-03-05 DIAGNOSIS — J45901 Unspecified asthma with (acute) exacerbation: Secondary | ICD-10-CM | POA: Insufficient documentation

## 2014-03-05 LAB — CBC
HCT: 38.7 % (ref 36.0–46.0)
Hemoglobin: 13.1 g/dL (ref 12.0–15.0)
MCH: 27.2 pg (ref 26.0–34.0)
MCHC: 33.9 g/dL (ref 30.0–36.0)
MCV: 80.5 fL (ref 78.0–100.0)
Platelets: 274 10*3/uL (ref 150–400)
RBC: 4.81 MIL/uL (ref 3.87–5.11)
RDW: 14.5 % (ref 11.5–15.5)
WBC: 5.8 10*3/uL (ref 4.0–10.5)

## 2014-03-05 LAB — BASIC METABOLIC PANEL
Anion gap: 17 — ABNORMAL HIGH (ref 5–15)
BUN: 12 mg/dL (ref 6–23)
CO2: 21 mEq/L (ref 19–32)
Calcium: 9.5 mg/dL (ref 8.4–10.5)
Chloride: 101 mEq/L (ref 96–112)
Creatinine, Ser: 0.68 mg/dL (ref 0.50–1.10)
GFR calc Af Amer: >90 mL/min (ref >90)
GFR calc non Af Amer: >90 mL/min (ref >90)
Glucose, Bld: 81 mg/dL (ref 70–99)
Potassium: 4 mEq/L (ref 3.7–5.3)
Sodium: 139 mEq/L (ref 137–147)

## 2014-03-05 LAB — TROPONIN I: Troponin I: <0.3 ng/mL (ref <0.30)

## 2014-03-05 LAB — TSH: TSH: 1.1 u[IU]/mL (ref 0.350–4.500)

## 2014-03-05 LAB — PRO B NATRIURETIC PEPTIDE: Pro B Natriuretic peptide (BNP): 9.7 pg/mL (ref 0–125)

## 2014-03-05 NOTE — Discharge Instructions (Signed)
Please call your doctor for a followup appointment within 24-48 hours. When you talk to your doctor please let them know that you were seen in the emergency department and have them acquire all of your records so that they can discuss the findings with you and formulate a treatment plan to fully care for your new and ongoing problems. ° °RESOURCE GUIDE ° °Chronic Pain Problems: °Contact [facility]  [phone] °Patients need to be referred by their primary care doctor. ° °Insufficient Money for Medicine: °Contact United Way:  call "211."  ° °No Primary Care Doctor: °- Call Health Connect  [phone] - can help you locate a primary care doctor that  accepts your insurance, provides certain services, etc. °- Physician Referral Service- [phone] ° °Agencies that provide inexpensive medical care: °- [facility]  [phone] °- [facility]  [phone] °- [facility]  [phone] °- [facility]  [phone] °- Planned Parenthood  [phone] °- [facility]  [phone] ° °Medicaid-accepting [place] Providers: °- [facility]- [address] ° [phone], Mon-Fri 9am-7pm, Sat 9am-1pm °- [facility]- [address], [address] ° [phone] °- [facility]- [address] ° [phone] °- [facility]- [address] [place] [address] ° [phone] °- Veita Bland- [address], [phone] ° Only accepts [place] Access Medicaid patients after they have their name  applied to their card ° °Self Pay (no insurance) in [place]: °- Sickle Cell Patients: Dr Eric Dean, [facility] ° [address], [phone] °- [facility]- [address] ° [phone] °      -     [facility]- [address] °      -     [facility]- see information above (Speak to Pam H if you do not have  insurance) °      -  [facility] [place]- [address],  [phone] °      -  [facility]- [address] [place] [address], [phone] °      -  Dr Osei-Bonsu-  [address], [place], [phone] °      -  [facility] - [address], [phone] °      -  [facility]- [address] [place] [address], [phone], also [address], [phone] °      -    [facility]- [address], [phone], 1st & 3rd Saturday °       every month, 10am-1pm ° °[facility] °[address] °[place], [place] [address] °([phone]) [phone] ° °[facility] °[address] °[place], [place] [address] °([phone]) [phone] ° °1) Find a Doctor and Pay Out of Pocket °Although you won't have to find out who is covered by your insurance plan, it is a good idea to ask around and get recommendations. You will then need to call the office and see if the doctor you have chosen will accept you as a new patient and what types of options they offer for patients who are self-pay. Some doctors offer discounts or will set up payment plans for their patients who do not have insurance, but you will need to ask so you aren't surprised when you   get to your appointment. ° °2) Contact Your Local Health Department °Not all health departments have doctors that can see patients for sick visits, but many do, so it is worth a call to see if yours does. If you don't know where your local health department is, you can check in your phone book. The CDC also has a tool to help you locate your state's health department, and many state websites also have listings of all of their local health departments. ° °3) Find a Walk-in Clinic °If your illness is not likely to be very severe or complicated, you may want to try a walk in clinic. These are popping up all over the country in pharmacies, drugstores, and shopping centers. They're usually staffed by nurse practitioners or physician  assistants that have been trained to treat common illnesses and complaints. They're usually fairly quick and inexpensive. However, if you have serious medical issues or chronic medical problems, these are probably not your best option ° °STD Testing °- [place] [facility] [place], [facility]D Clinic, [address], [place], phone [phone] or [phone].  Monday - Friday, call for an appointment. °- [place] [facility] [place], [facility]D Clinic, [address], [place], phone [phone] or [phone].  Monday - Friday, call for an appointment. ° °Abuse/Neglect: °- [place] Child Abuse Hotline (336) 641-3795 °- [place] Child Abuse Hotline 800-378-5315 (After Hours) ° °Emergency Shelter:  [facility] (336) 271-5985 ° °Maternity Homes: °- [facility] (336) 275-9566 °- [facility] (704) 372-4663 ° °MRSA Hotline #:   832-7006 ° °Dental Assistance °If unable to pay or uninsured, contact:  [place] [facility] to become qualified for the adult dental clinic. ° °Patients with Medicaid: [facility] °[address], [phone] °[address], [phone] ° °If unable to pay, or uninsured, contact [place] [facility] ([phone] in [place], [phone] in [place]) to become qualified for the adult dental clinic ° °[facility] °[address] °[place], [place] [address] °(336) 272-4177 °[email or web address] ° °Other Low-Cost Community Dental Services: °- [facility]- [address], [place], [place], [address], [phone], Ext. [address], 2nd and 4th Thursday of the month at 6:30am.  10 clients each day by appointment, can sometimes see walk-in patients if someone does not show for an appointment. °- [facility]- [address], [place], [place], [address], [phone] °- [facility]- [address], [place], [place],  [address], [phone] °- [facility]- [phone] °- [facility]- [phone] °- [facility]- [phone] °-  °

## 2014-03-05 NOTE — ED Provider Notes (Signed)
CSN: [id number]     Arrival date & time 03/05/14  1522 History   First MD Initiated Contact with Patient 03/05/14 1656     Chief Complaint  Patient presents with  . Shortness of Breath     (Consider location/radiation/quality/duration/timing/severity/associated sxs/prior Treatment) HPI    The patient is a 53 year old female, she has a history of asthma, history of gastric bypass in the past, she has not been taking her medications for a couple of years including her multivitamins, B12 etc. She states that today she was cooking breakfast and started to feel palpitations associated with some shortness of breath. This persisted for several minutes, that improved, it came back when she was in the store shopping for food, then it went away. At this time the patient feels well and does not have those palpitations. She denies any other symptoms including fevers chills nausea vomiting diarrhea dysuria swelling and has no chest pain back pain or visual changes.  Past Medical History  Diagnosis Date  . Asthma   . Blood transfusion without reported diagnosis   . Stroke 2010    TIA  . Diabetes mellitus without complication    Past Surgical History  Procedure Laterality Date  . Gastric bypass  2007   No family history on file. History  Substance Use Topics  . Smoking status: Former Games developermoker  . Smokeless tobacco: Not on file  . Alcohol Use: Yes     Comment: quit 90 days ago   OB History    No data available     Review of Systems  All other systems reviewed and are negative.     Allergies  Review of patient's allergies indicates no known allergies.  Home Medications   Prior to Admission medications   Medication Sig Start Date End Date Taking? Authorizing Provider  Multiple Vitamins-Minerals (MULTI ADULT GUMMIES) CHEW Chew 1 tablet by mouth daily.   Yes Historical Provider, MD   BP 146/89 mmHg  Pulse 110  Temp(Src) 97.5 F (36.4 C)  Resp 18  SpO2 100% Physical Exam   Constitutional: She appears well-developed and well-nourished. No distress.  HENT:  Head: Normocephalic and atraumatic.  Mouth/Throat: Oropharynx is clear and moist. No oropharyngeal exudate.  Eyes: Conjunctivae and EOM are normal. Pupils are equal, round, and reactive to light. Right eye exhibits no discharge. Left eye exhibits no discharge. No scleral icterus.  Neck: Normal range of motion. Neck supple. No JVD present. No thyromegaly present.  Cardiovascular: Normal rate, regular rhythm, normal heart sounds and intact distal pulses.  Exam reveals no gallop and no friction rub.   No murmur heard. Pulmonary/Chest: Effort normal and breath sounds normal. No respiratory distress. She has no wheezes. She has no rales.  Abdominal: Soft. Bowel sounds are normal. She exhibits no distension and no mass. There is no tenderness.  Musculoskeletal: Normal range of motion. She exhibits no edema or tenderness.  Lymphadenopathy:    She has no cervical adenopathy.  Neurological: She is alert. Coordination normal.  Skin: Skin is warm and dry. No rash noted. No erythema.  Psychiatric: She has a normal mood and affect. Her behavior is normal.  Nursing note and vitals reviewed.   ED Course  Procedures (including critical care time) Labs Review Labs Reviewed  BASIC METABOLIC PANEL - Abnormal; Notable for the following:    Anion gap 17 (*)    All other components within normal limits  CBC  PRO B NATRIURETIC PEPTIDE  TROPONIN I  TSH  Imaging Review Dg Chest 2 View  03/05/2014   CLINICAL DATA:  Shortness of breath  EXAM: CHEST  2 VIEW  COMPARISON:  05/17/2012  FINDINGS: The heart size and mediastinal contours are within normal limits. Both lungs are clear. The visualized skeletal structures are unremarkable.  IMPRESSION: No active cardiopulmonary disease.   Electronically Signed   By: Christiana PellantGretchen  Green M.D.   On: 03/05/2014 17:19     EKG Interpretation   Date/Time:  Saturday March 05 2014  15:30:12 EST Ventricular Rate:  103 PR Interval:  144 QRS Duration: 66 QT Interval:  352 QTC Calculation: 461 R Axis:   35 Text Interpretation:  Sinus tachycardia with occasional Premature  ventricular complexes Septal infarct , age undetermined Abnormal ECG since  last tracing no significant change PVC's persist Confirmed by Hyacinth MeekerMILLER  MD,  Zyon Grout ([id number]) on 03/05/2014 5:01:42 PM      ED ECG REPORT  I personally interpreted this EKG   Date: 03/05/2014  18:46 PM  Rate: 75  Rhythm: normal sinus rhythm  QRS Axis: normal  Intervals: normal  ST/T Wave abnormalities: normal  Conduction Disutrbances:none  Narrative Interpretation: no ectopy seen on current ECG  Old EKG Reviewed: unchanged   MDM   Final diagnoses:  Palpitations  Shortness of breath   No palpitations or SOb at this time, labs reassuring, CXR normal, ECG to be repeated, TSH added - needs communtiy f/u - doubt PE / PNA / PTX.  Repeat ECG normal - pt appears stable for d/c.  Would benefit from outpt testing on holter.  Pt in agreement.  Pt aware needs to f/u for TSH result.    Vida RollerBrian D Necole Minassian, MD 03/05/14 [phone]

## 2014-03-05 NOTE — ED Notes (Signed)
The pt is c/o sob with chest discomfort.  She is very anxious and she feel dizzy symptoms started today

## 2014-03-05 NOTE — ED Notes (Signed)
Pt st's this am when she was cooking breakfast she started to feel weak and felt some palpitations.   St's she started to feel better so she went shopping.  Pt st's after getting home she started having the same feelings again.

## 2014-03-11 ENCOUNTER — Other Ambulatory Visit: Payer: Self-pay | Admitting: Family Medicine

## 2014-03-11 DIAGNOSIS — Z1231 Encounter for screening mammogram for malignant neoplasm of breast: Secondary | ICD-10-CM

## 2014-03-23 ENCOUNTER — Ambulatory Visit: Payer: BC Managed Care – PPO

## 2014-03-31 ENCOUNTER — Ambulatory Visit
Admission: RE | Admit: 2014-03-31 | Discharge: 2014-03-31 | Disposition: A | Discharge status: Home / Self Care | Payer: Medicaid Other | Source: Ambulatory Visit | Attending: Family Medicine | Admitting: Family Medicine

## 2014-03-31 DIAGNOSIS — Z1231 Encounter for screening mammogram for malignant neoplasm of breast: Secondary | ICD-10-CM

## 2014-05-12 ENCOUNTER — Institutional Professional Consult (permissible substitution): Payer: Medicaid Other | Admitting: Cardiovascular Disease

## 2014-05-31 ENCOUNTER — Encounter: Payer: Self-pay | Admitting: Cardiovascular Disease

## 2014-07-18 ENCOUNTER — Encounter (HOSPITAL_COMMUNITY): Payer: Self-pay | Admitting: Emergency Medicine

## 2014-07-18 ENCOUNTER — Emergency Department (HOSPITAL_COMMUNITY): Payer: Medicaid Other

## 2014-07-18 ENCOUNTER — Emergency Department (HOSPITAL_COMMUNITY)
Admission: EM | Admit: 2014-07-18 | Discharge: 2014-07-19 | Disposition: A | Discharge status: Home / Self Care | Payer: Medicaid Other | Attending: Emergency Medicine | Admitting: Emergency Medicine

## 2014-07-18 DIAGNOSIS — R002 Palpitations: Secondary | ICD-10-CM | POA: Diagnosis not present

## 2014-07-18 DIAGNOSIS — Z8659 Personal history of other mental and behavioral disorders: Secondary | ICD-10-CM | POA: Insufficient documentation

## 2014-07-18 DIAGNOSIS — R42 Dizziness and giddiness: Secondary | ICD-10-CM | POA: Insufficient documentation

## 2014-07-18 HISTORY — DX: Anxiety disorder, unspecified: F41.9

## 2014-07-18 LAB — CBC WITH DIFFERENTIAL/PLATELET
Basophils Absolute: 0 10*3/uL (ref 0.0–0.1)
Basophils Relative: 0 % (ref 0–1)
Eosinophils Absolute: 0.1 10*3/uL (ref 0.0–0.7)
Eosinophils Relative: 1 % (ref 0–5)
HCT: 39.5 % (ref 36.0–46.0)
Hemoglobin: 13.2 g/dL (ref 12.0–15.0)
Lymphocytes Relative: 30 % (ref 12–46)
Lymphs Abs: 1.9 10*3/uL (ref 0.7–4.0)
MCH: 26.6 pg (ref 26.0–34.0)
MCHC: 33.4 g/dL (ref 30.0–36.0)
MCV: 79.5 fL (ref 78.0–100.0)
Monocytes Absolute: 0.5 10*3/uL (ref 0.1–1.0)
Monocytes Relative: 7 % (ref 3–12)
Neutro Abs: 3.8 10*3/uL (ref 1.7–7.7)
Neutrophils Relative %: 62 % (ref 43–77)
Platelets: 255 10*3/uL (ref 150–400)
RBC: 4.97 MIL/uL (ref 3.87–5.11)
RDW: 15.1 % (ref 11.5–15.5)
WBC: 6.2 10*3/uL (ref 4.0–10.5)

## 2014-07-18 LAB — URINALYSIS, ROUTINE W REFLEX MICROSCOPIC
Bilirubin Urine: NEGATIVE
Glucose, UA: NEGATIVE mg/dL
Hgb urine dipstick: NEGATIVE
Ketones, ur: NEGATIVE mg/dL
Nitrite: NEGATIVE
Protein, ur: NEGATIVE mg/dL
Specific Gravity, Urine: 1.016 (ref 1.005–1.030)
Urobilinogen, UA: 0.2 mg/dL (ref 0.0–1.0)
pH: 5 (ref 5.0–8.0)

## 2014-07-18 LAB — URINE MICROSCOPIC-ADD ON

## 2014-07-18 LAB — I-STAT TROPONIN, ED: Troponin i, poc: 0 ng/mL (ref 0.00–0.08)

## 2014-07-18 LAB — BASIC METABOLIC PANEL
Anion gap: 14 (ref 5–15)
BUN: 9 mg/dL (ref 6–23)
CO2: 19 mmol/L (ref 19–32)
Calcium: 9.6 mg/dL (ref 8.4–10.5)
Chloride: 108 mmol/L (ref 96–112)
Creatinine, Ser: 0.74 mg/dL (ref 0.50–1.10)
GFR calc Af Amer: >90 mL/min (ref >90)
GFR calc non Af Amer: >90 mL/min (ref >90)
Glucose, Bld: 70 mg/dL (ref 70–99)
Potassium: 3.7 mmol/L (ref 3.5–5.1)
Sodium: 141 mmol/L (ref 135–145)

## 2014-07-18 LAB — BRAIN NATRIURETIC PEPTIDE: B Natriuretic Peptide: 18.2 pg/mL (ref 0.0–100.0)

## 2014-07-18 MED ORDER — MECLIZINE HCL 25 MG PO TABS
50.0000 mg | ORAL_TABLET | Freq: Once | ORAL | Status: AC
  Administered 2014-07-18: 50 mg via ORAL
  Filled 2014-07-18: qty 2

## 2014-07-18 MED ORDER — SODIUM CHLORIDE 0.9 % IV BOLUS (SEPSIS)
1000.0000 mL | Freq: Once | INTRAVENOUS | Status: AC
  Administered 2014-07-18: 1000 mL via INTRAVENOUS

## 2014-07-18 MED ORDER — SODIUM CHLORIDE 0.9 % IV SOLN
Freq: Once | INTRAVENOUS | Status: AC
  Administered 2014-07-18: 21:00:00 via INTRAVENOUS

## 2014-07-18 NOTE — ED Notes (Signed)
Pt c/o dizziness and palpitations with SOB starting this afternoon; pt sts hx of same in past

## 2014-07-18 NOTE — ED Provider Notes (Signed)
CSN: [id number]     Arrival date & time 07/18/14  1658 History   First MD Initiated Contact with Patient 07/18/14 1955     Chief Complaint  Patient presents with  . Dizziness  . Palpitations     (Consider location/radiation/quality/duration/timing/severity/associated sxs/prior Treatment) HPI Comments: 54 year old female with a history of gastric bypass surgery, anemia, anxiety who presents with point of being dizzy and having palpitations.  She states she was in her normal state of health, sitting on the couch watching TV when she got up to make her husband.  This exam, which time she got to the kitchen and leaned over to get a can of tuna fish out of the cabinet.  She was dizzy and having palpitations, which then produces mild shortness of breath.  She states she made her way back to the separately down.  She tried cooling herself off with ice and fluids.  She tried ice cream, but with her gastric bypass surgery.  She knows that this causes nausea, which it didn't.  She had 1 small emesis.  Denies any recent illnesses, history of asthma.  States she was admitted for the same symptoms back in March, but she did not follow through with the cardiologist with the endocrinologist.  She does not want know what her thyroid screen showed she was supposed to have a stress test which she could not do because of swelling from a previous injury in her right foot.  Patient is a 54 y.o. female presenting with dizziness and palpitations. The history is provided by the patient.  Dizziness Quality:  Lightheadedness Severity:  Moderate Onset quality:  Sudden Duration:  3 hours Timing:  Intermittent Progression:  Unchanged Chronicity:  Recurrent Context: physical activity and standing up   Context: not with head movement, not with inactivity and not with loss of consciousness   Relieved by:  Lying down Worsened by:  Standing up Ineffective treatments:  None tried Associated symptoms: palpitations    Associated symptoms: no chest pain, no headaches, no nausea, no shortness of breath, no syncope, no tinnitus, no vomiting and no weakness   Risk factors: anemia, heart disease and hx of stroke   Risk factors: no hx of vertigo, no multiple medications and no new medications   Palpitations Palpitations quality:  Irregular Onset quality:  With exertion Duration:  3 hours Progression:  Improving Chronicity:  Recurrent Context: not anxiety, not appetite suppressants, not blood loss, not bronchodilators, not caffeine, not dehydration, not exercise, not hyperventilation, not illicit drugs, not nicotine and not stimulant use   Relieved by:  None tried Ineffective treatments:  None tried Associated symptoms: dizziness   Associated symptoms: no back pain, no chest pain, no chest pressure, no cough, no leg pain, no lower extremity edema, no malaise/fatigue, no nausea, no near-syncope, no numbness, no orthopnea, no shortness of breath, no syncope, no vomiting and no weakness   Dizziness:    Severity:  Moderate   Duration:  3 hours   Timing:  Constant   Progression:  Improving Risk factors: no hx of DVT, no hx of PE and no stress     Past Medical History  Diagnosis Date  . Anxiety    History reviewed. No pertinent past surgical history. History reviewed. No pertinent family history. History  Substance Use Topics  . Smoking status: Never Smoker   . Smokeless tobacco: Not on file  . Alcohol Use: Yes   OB History    No data available  Review of Systems  Constitutional: Negative for fever and malaise/fatigue.  HENT: Negative for tinnitus.   Respiratory: Negative for cough and shortness of breath.   Cardiovascular: Positive for palpitations. Negative for chest pain, orthopnea, syncope and near-syncope.  Gastrointestinal: Negative for nausea and vomiting.  Musculoskeletal: Negative for myalgias and back pain.  Skin: Negative for color change, pallor and rash.  Neurological: Positive  for dizziness. Negative for weakness, numbness and headaches.  All other systems reviewed and are negative.     Allergies  Review of patient's allergies indicates no known allergies.  Home Medications   Prior to Admission medications   Not on File   BP 120/75 mmHg  Pulse 100  Temp(Src) 98.2 F (36.8 C) (Oral)  Resp 18  SpO2 99% Physical Exam  Constitutional: She is oriented to person, place, and time. She appears well-developed and well-nourished.  HENT:  Head: Normocephalic.  Eyes: Pupils are equal, round, and reactive to light.  Neck: Normal range of motion.  Cardiovascular: Normal rate and regular rhythm.   Pulmonary/Chest: Effort normal.  Abdominal: Soft.  Musculoskeletal: Normal range of motion.  Neurological: She is alert and oriented to person, place, and time.  Skin: Skin is warm. No rash noted.  Nursing note and vitals reviewed.   ED Course  Procedures (including critical care time) Labs Review Labs Reviewed  BASIC METABOLIC PANEL  BRAIN NATRIURETIC PEPTIDE  CBC WITH DIFFERENTIAL/PLATELET  URINALYSIS, ROUTINE W REFLEX MICROSCOPIC  I-STAT TROPOININ, ED    Imaging Review Dg Chest 2 View  07/18/2014   CLINICAL DATA:  Dizziness and palpitations  EXAM: CHEST  2 VIEW  COMPARISON:  None.  FINDINGS: The heart size and mediastinal contours are within normal limits. Both lungs are clear. The visualized skeletal structures are unremarkable.  IMPRESSION: No active cardiopulmonary disease.   Electronically Signed   By: Jolaine Click M.D.   On: 07/18/2014 18:38     EKG Interpretation None     TIMI score 6 Heart score 3 Patient's head CT is normal.  She is not orthostatic by blood pressure or pulse.  Patient was ambulated and still felt lightheaded like she had to hold onto things to walk safely.  We will try IV fluids and meclizine and reassess He was ambulated after IV fluid and meclizine although she is still holding on.  When questioned she said it just makes  her feel safe.  She denies dizziness at this time.  She would like to go home and follow up with her primary care physician MDM   Final diagnoses:  Dizzy spells         Earley Favor, NP 07/19/14 0028  Arby Barrette, MD 07/19/14 [phone]

## 2014-07-19 MED ORDER — MECLIZINE HCL 50 MG PO TABS: 25.0000 mg | ORAL_TABLET | Freq: Three times a day (TID) | ORAL | Status: DC | PRN

## 2014-07-19 NOTE — Discharge Instructions (Signed)
Dizziness  Dizziness means you feel unsteady or lightheaded. You might feel like you are going to pass out (faint). HOME CARE   Drink enough fluids to keep your pee (urine) clear or pale yellow.  Take your medicines exactly as told by your doctor. If you take blood pressure medicine, always stand up slowly from the lying or sitting position. Hold on to something to steady yourself.  If you need to stand in one place for a long time, move your legs often. Tighten and relax your leg muscles.  Have someone stay with you until you feel okay.  Do not drive or use heavy machinery if you feel dizzy.  Do not drink alcohol. GET HELP RIGHT AWAY IF:   You feel dizzy or lightheaded and it gets worse.  You feel sick to your stomach (nauseous), or you throw up (vomit).  You have trouble talking or walking.  You feel weak or have trouble using your arms, hands, or legs.  You cannot think clearly or have trouble forming sentences.  You have chest pain, belly (abdominal) pain, sweating, or you are short of breath.  Your vision changes.  You are bleeding.  You have problems from your medicine that seem to be getting worse. MAKE SURE YOU:   Understand these instructions.  Will watch your condition.  Will get help right away if you are not doing well or get worse. Document Released: 03/07/2011 Document Revised: 06/10/2011 Document Reviewed: 03/07/2011 [facility] Patient Information 2015 [employer] [place]. This information is not intended to replace advice given to you by your health care provider. Make sure you discuss any questions you have with your health care provider. Today you were evaluated for your episodes of dizziness, your cardiac markers are normal.  Your head CT is normal, your EKG is normal, your not orthostatic.  He been given IV fluids and a medication called meclizine which helped your symptoms, but it is very important that you follow through with you.  Cardiology evaluation  previously outlined for you

## 2014-07-26 ENCOUNTER — Inpatient Hospital Stay (HOSPITAL_COMMUNITY)
Admission: AD | Admit: 2014-07-26 | Discharge: 2014-07-27 | DRG: 287 | Disposition: A | Discharge status: Home / Self Care | Payer: Medicaid Other | Source: Ambulatory Visit | Attending: Cardiology | Admitting: Cardiology

## 2014-07-26 ENCOUNTER — Encounter (HOSPITAL_COMMUNITY): Payer: Self-pay | Admitting: General Practice

## 2014-07-26 DIAGNOSIS — Z23 Encounter for immunization: Secondary | ICD-10-CM | POA: Diagnosis not present

## 2014-07-26 DIAGNOSIS — Z8673 Personal history of transient ischemic attack (TIA), and cerebral infarction without residual deficits: Secondary | ICD-10-CM | POA: Diagnosis not present

## 2014-07-26 DIAGNOSIS — J45909 Unspecified asthma, uncomplicated: Secondary | ICD-10-CM | POA: Diagnosis present

## 2014-07-26 DIAGNOSIS — F1010 Alcohol abuse, uncomplicated: Secondary | ICD-10-CM | POA: Diagnosis present

## 2014-07-26 DIAGNOSIS — Z87891 Personal history of nicotine dependence: Secondary | ICD-10-CM

## 2014-07-26 DIAGNOSIS — I200 Unstable angina: Principal | ICD-10-CM | POA: Diagnosis present

## 2014-07-26 DIAGNOSIS — E119 Type 2 diabetes mellitus without complications: Secondary | ICD-10-CM | POA: Diagnosis present

## 2014-07-26 DIAGNOSIS — Z9884 Bariatric surgery status: Secondary | ICD-10-CM | POA: Diagnosis not present

## 2014-07-26 DIAGNOSIS — R079 Chest pain, unspecified: Secondary | ICD-10-CM | POA: Diagnosis present

## 2014-07-26 DIAGNOSIS — I249 Acute ischemic heart disease, unspecified: Secondary | ICD-10-CM | POA: Diagnosis present

## 2014-07-26 HISTORY — DX: Vitamin D deficiency, unspecified: E55.9

## 2014-07-26 LAB — CBC WITH DIFFERENTIAL/PLATELET
Basophils Absolute: 0 10*3/uL (ref 0.0–0.1)
Basophils Relative: 0 % (ref 0–1)
Eosinophils Absolute: 0 10*3/uL (ref 0.0–0.7)
Eosinophils Relative: 1 % (ref 0–5)
HCT: 38.2 % (ref 36.0–46.0)
Hemoglobin: 12.7 g/dL (ref 12.0–15.0)
Lymphocytes Relative: 33 % (ref 12–46)
Lymphs Abs: 2.1 10*3/uL (ref 0.7–4.0)
MCH: 26.7 pg (ref 26.0–34.0)
MCHC: 33.2 g/dL (ref 30.0–36.0)
MCV: 80.4 fL (ref 78.0–100.0)
Monocytes Absolute: 0.5 10*3/uL (ref 0.1–1.0)
Monocytes Relative: 7 % (ref 3–12)
Neutro Abs: 3.7 10*3/uL (ref 1.7–7.7)
Neutrophils Relative %: 59 % (ref 43–77)
Platelets: 227 10*3/uL (ref 150–400)
RBC: 4.75 MIL/uL (ref 3.87–5.11)
RDW: 15.7 % — ABNORMAL HIGH (ref 11.5–15.5)
WBC: 6.3 10*3/uL (ref 4.0–10.5)

## 2014-07-26 LAB — LIPID PANEL
Cholesterol: 262 mg/dL — ABNORMAL HIGH (ref 0–200)
HDL: 156 mg/dL (ref >39)
LDL Cholesterol: 95 mg/dL (ref 0–99)
Total CHOL/HDL Ratio: 1.7 RATIO
Triglycerides: 56 mg/dL (ref <150)
VLDL: 11 mg/dL (ref 0–40)

## 2014-07-26 LAB — BASIC METABOLIC PANEL
Anion gap: 13 (ref 5–15)
BUN: 12 mg/dL (ref 6–23)
CO2: 24 mmol/L (ref 19–32)
Calcium: 9.4 mg/dL (ref 8.4–10.5)
Chloride: 102 mmol/L (ref 96–112)
Creatinine, Ser: 0.79 mg/dL (ref 0.50–1.10)
GFR calc Af Amer: >90 mL/min (ref >90)
GFR calc non Af Amer: >90 mL/min (ref >90)
Glucose, Bld: 78 mg/dL (ref 70–99)
Potassium: 3.9 mmol/L (ref 3.5–5.1)
Sodium: 139 mmol/L (ref 135–145)

## 2014-07-26 LAB — TROPONIN I: Troponin I: <0.03 ng/mL (ref <0.031)

## 2014-07-26 LAB — PROTIME-INR
INR: 1.05 (ref 0.00–1.49)
Prothrombin Time: 13.8 seconds (ref 11.6–15.2)

## 2014-07-26 LAB — GLUCOSE, CAPILLARY: Glucose-Capillary: 83 mg/dL (ref 70–99)

## 2014-07-26 MED ORDER — ASPIRIN EC 81 MG PO TBEC
81.0000 mg | DELAYED_RELEASE_TABLET | Freq: Every day | ORAL | Status: DC
  Administered 2014-07-27: 81 mg via ORAL
  Filled 2014-07-26: qty 1

## 2014-07-26 MED ORDER — SODIUM CHLORIDE 0.9 % IV SOLN
INTRAVENOUS | Status: DC
  Administered 2014-07-27: 05:00:00 via INTRAVENOUS

## 2014-07-26 MED ORDER — NITROGLYCERIN 0.4 MG SL SUBL: 0.4000 mg | SUBLINGUAL_TABLET | SUBLINGUAL | Status: DC | PRN

## 2014-07-26 MED ORDER — ASPIRIN 81 MG PO CHEW
324.0000 mg | CHEWABLE_TABLET | ORAL | Status: AC
  Administered 2014-07-26: 324 mg via ORAL
  Filled 2014-07-26: qty 4

## 2014-07-26 MED ORDER — HEPARIN (PORCINE) IN NACL 100-0.45 UNIT/ML-% IJ SOLN
1100.0000 [IU]/h | INTRAMUSCULAR | Status: DC
  Administered 2014-07-26: 900 [IU]/h via INTRAVENOUS
  Filled 2014-07-26: qty 250

## 2014-07-26 MED ORDER — SODIUM CHLORIDE 0.9 % IV SOLN
INTRAVENOUS | Status: AC
  Administered 2014-07-26 – 2014-07-27 (×2): via INTRAVENOUS

## 2014-07-26 MED ORDER — SODIUM CHLORIDE 0.9 % IV SOLN: 250.0000 mL | INTRAVENOUS | Status: DC | PRN

## 2014-07-26 MED ORDER — HEPARIN BOLUS VIA INFUSION
3000.0000 [IU] | Freq: Once | INTRAVENOUS | Status: AC
  Administered 2014-07-26: 3000 [IU] via INTRAVENOUS
  Filled 2014-07-26: qty 3000

## 2014-07-26 MED ORDER — PNEUMOCOCCAL VAC POLYVALENT 25 MCG/0.5ML IJ INJ
0.5000 mL | INJECTION | INTRAMUSCULAR | Status: AC
  Administered 2014-07-27: 0.5 mL via INTRAMUSCULAR
  Filled 2014-07-26: qty 0.5

## 2014-07-26 MED ORDER — ATORVASTATIN CALCIUM 80 MG PO TABS
80.0000 mg | ORAL_TABLET | Freq: Every day | ORAL | Status: DC
  Administered 2014-07-26: 80 mg via ORAL
  Filled 2014-07-26: qty 1

## 2014-07-26 MED ORDER — SODIUM CHLORIDE 0.9 % IJ SOLN: 3.0000 mL | INTRAMUSCULAR | Status: DC | PRN

## 2014-07-26 MED ORDER — ASPIRIN 300 MG RE SUPP: 300.0000 mg | RECTAL | Status: AC

## 2014-07-26 MED ORDER — METOPROLOL TARTRATE 25 MG PO TABS
25.0000 mg | ORAL_TABLET | Freq: Two times a day (BID) | ORAL | Status: DC
  Administered 2014-07-26 – 2014-07-27 (×2): 25 mg via ORAL
  Filled 2014-07-26 (×2): qty 1

## 2014-07-26 MED ORDER — SODIUM CHLORIDE 0.9 % IJ SOLN
3.0000 mL | Freq: Two times a day (BID) | INTRAMUSCULAR | Status: DC
  Administered 2014-07-26: 3 mL via INTRAVENOUS

## 2014-07-26 MED ORDER — ONDANSETRON HCL 4 MG/2ML IJ SOLN
4.0000 mg | Freq: Four times a day (QID) | INTRAMUSCULAR | Status: DC | PRN
Start: 2014-07-26 — End: 2014-07-27

## 2014-07-26 MED ORDER — ACETAMINOPHEN 325 MG PO TABS: 650.0000 mg | ORAL_TABLET | ORAL | Status: DC | PRN

## 2014-07-26 NOTE — Progress Notes (Signed)
ANTICOAGULATION CONSULT NOTE - Initial Consult  Pharmacy Consult:  Heparin Indication: chest pain/ACS  No Known Allergies  Patient Measurements: Height: 5\' 5"  (165.1 cm) Weight: 176 lb 6.4 oz (80.015 kg) IBW/kg (Calculated) : 57 Heparin Dosing Weight: 74 kg  Vital Signs: Temp: 99 F (37.2 C) (04/26 1507) Temp Source: Oral (04/26 1507) BP: 142/86 mmHg (04/26 1507) Pulse Rate: 72 (04/26 1507)  Labs: No results for input(s): HGB, HCT, PLT, APTT, LABPROT, INR, HEPARINUNFRC, CREATININE, CKTOTAL, CKMB, TROPONINI in the last 72 hours.  CrCl cannot be calculated (Patient has no serum creatinine result on file.).   Medical History: Past Medical History  Diagnosis Date  . Asthma   . Blood transfusion without reported diagnosis   . Stroke 2010    TIA  . Diabetes mellitus without complication       Assessment: [name] [name] presented to PCP office with tightness in the left neck and left chest, sweating, dizziness and SOB.  Patient was directly admitted to the hospital.  Pharmacy consulted to initiate IV heparin for ACS.  Baseline labs pending collection (plts from December of 2015 is 274).   Goal of Therapy:  Heparin level 0.3-0.7 units/ml Monitor platelets by anticoagulation protocol: Yes    Plan:  - Heparin 3500 units IV bolus x 1, then - Heparin gtt at 900 units/hr - Check 6 hr HL - Daily HL / CBC    Abie Cheek D. Laney Potashang, PharmD, BCPS Pager:  [phone] 07/26/2014, 7:32 PM

## 2014-07-26 NOTE — Progress Notes (Signed)
PT states she is dizzy, RN made pt aware that the bed alarm is set for her safety. Pt does not wish to have the bed alarm on. Pt educated

## 2014-07-26 NOTE — H&P (Signed)
[name] [name] is an 54 y.o. female.   Chief Complaint: Pt c/o tightness in left neck, left chest, sobr, sweating, dizziness. Pt states she took Tramadol for foot pain yesterday and one today. HPI: The patient is a 54 year old female who presents for a follow-up for Shortness of breath. [name] [name] is 54 years old African-American female. After patient arrived to our office, she started feeling tightness in the left side of the neck and left upper anterior chest. She also had dizziness and sweating. Symptoms lasted more than a few minutes. We did an EKG and then gave one tablet of nitroglycerin sublingual. Neck and chest tightness was relieved after that.  She has c/o of exertional shortness of breath for past one year. It is associated with feeling of heaviness in the chest and rapid heart beat. She develops these symptoms on walking for about a couple of blocks. Heaviness is felt in the anterior chest and sometimes radiates to back of the chest. There is no radiation to the arms or neck. No history of associated diaphoresis, nausea or vomiting. She also feels rapid heartbeat with exertion. All the symptoms subside at rest in couple of minutes. She denies any dyspnea or rapid heartbeat at rest. No orthopnea or PND. No history of palpitation at other times. No complaints of swelling on the legs.  Patient is also complaining of dizziness on standing up suddenly. No history of syncope at any time.  No history of hypertension or diabetes. Her cholesterol was told to be borderline high. She does not smoke. There is questionable history of TIA in the past. No history of thyroid problems.  Patient has history of heavy alcohol abuse in the past. She says that she drank pretty much all day for past 6-7 years. She used to drink [employer] and [employer]. However, patient claims to have quit drinking for few weeks. She said that she had been drinking some in past few days.  She drinks one cup of  coffee daily. She drinks a lot of sodas off and on. She does not take any decongestant medications.  She had bariatric surgery in 2007. Patient used to weigh more than 300 pounds. Her weight has remained stable since the surgery.   Past Medical History  Diagnosis Date  . Asthma   . Blood transfusion without reported diagnosis   . Stroke 2010    TIA  . Diabetes mellitus without complication     Past Surgical History  Procedure Laterality Date  . Gastric bypass  2007    No family history on file. Social History:  reports that she has quit smoking. She does not have any smokeless tobacco history on file. She reports that she drinks alcohol. Her drug history is not on file.  Allergies: No Known Allergies  Medications Prior to Admission  Medication Sig Dispense Refill  . Multiple Vitamins-Minerals (MULTI ADULT GUMMIES) CHEW Chew 1 tablet by mouth daily.        Review of Systems - Fatigue present.  No recent weight changes.  Chest pain and dyspnea on exertion present, no PND or orthopnea.  No cough.  No history of endocrine problems, no history of GI bleed, abnormal bleeding.  Not a diabetic. Other systems negative.  Blood pressure 142/86, pulse 72, temperature 99 F (37.2 C), temperature source Oral, resp. rate 22, height 5\' 5"  (1.651 m), weight 80.015 kg (176 lb 6.4 oz), SpO2 100 %. General appearance: alert, cooperative, appears stated age, no distress and mildly obese  Eyes: negative findings: lids and lashes normal Neck: no adenopathy, no carotid bruit, no JVD and supple, symmetrical, trachea midline Neck: JVP - normal, carotids 2+= without bruits Resp: clear to auscultation bilaterally Chest wall: no tenderness Cardio: regular rate and rhythm, S1, S2 normal, no murmur, click, rub or gallop and normal apical impulse GI: soft, non-tender; bowel sounds normal; no masses,  no organomegaly Extremities: extremities normal, atraumatic, no cyanosis or edema Pulses: 2+ and  symmetric Skin: Skin color, texture, turgor normal. No rashes or lesions Neurologic: Grossly normal  Results for orders placed or performed during the hospital encounter of 07/26/14 (from the past 48 hour(s))  Glucose, capillary     Status: None   Collection Time: 07/26/14  2:53 PM  Result Value Ref Range   Glucose-Capillary 83 70 - 99 mg/dL   No results found.  Labs:   Lab Results  Component Value Date   WBC 5.8 03/05/2014   HGB 13.1 03/05/2014   HCT 38.7 03/05/2014   MCV 80.5 03/05/2014   PLT 274 03/05/2014   No results for input(s): NA, K, CL, CO2, BUN, CREATININE, CALCIUM, PROT, BILITOT, ALKPHOS, ALT, AST, GLUCOSE in the last 168 hours.  Invalid input(s): LABALBU Lab Results  Component Value Date   TROPONINI <0.30 03/05/2014    Lipid Panel  No results found for: CHOL, TRIG, HDL, CHOLHDL, VLDL, LDLCALC  EKG: 07/26/2014: Sinus rhythm, normal axis, poor R-wave progression, cannot exclude anterior infarct old.  No evidence of ischemia.  Outpatient testing: Lexiscan myoview stress test 07/25/2014: 1. The resting electrocardiogram demonstrated normal sinus rhythm, normal resting conduction, IVCD, no resting arrhythmias and normal rest repolarization. Stress EKG is non-diagnostic for ischemia as it a pharmacologic stress using Lexiscan. Stress symptoms included dyspnea, dizziness. 2. This is an abnormal myocardial perfusion imaging study demonstrating a mixture of scar plus ischemia in the basal inferior, mid inferoseptal, mid inferior, apical septal and apical inferior myocardial wall(s). Overall left ventricular systolic function was abnormal with regional wall motion abnormalities in the same region. LVEF 43%. This is a high risk scan.   Assessment/Plan 1.  Resting chest pain suggestive of unstable angina pectoris 2.  Abnormal nuclear stress test, high risk with reduced LVEF of 43% with inferior wall scar with ischemia 3.  History of alcohol abuse in the past, patient  states that she has started drinking, exact amount difficult to gauge. 4.  History of gastric bypass surgery  Recommendation: We do not have any labs on her, lipid profile is unknown.  She was seen and evaluated by my partner, Dr. Florian Buff today and felt that due to rest pain and abnormal stress, patient needs to be admitted to the hospital for further evaluation.  She'll be admitted for unstable angina, ruled out for myocardial infarction and will need coronary angiography for definitive diagnosis of CAD.  Patient has been explained in detail regarding the risk of coronary angiography, including but not limited to less than 1% risk of death, stroke, MI, need for urgent CABG but not limited to these.  Patient is willing to proceed.  We will try to perform angiography today, if not possible, she'll be set up for angiography in the morning.  Pamella Pert, MD 07/26/2014, 4:06 PM [facility] Pager: [phone] Office: [phone] If no answer: Cell:  [phone]

## 2014-07-27 ENCOUNTER — Encounter (HOSPITAL_COMMUNITY): Payer: Self-pay | Admitting: Cardiology

## 2014-07-27 ENCOUNTER — Other Ambulatory Visit: Payer: Self-pay

## 2014-07-27 ENCOUNTER — Encounter (HOSPITAL_COMMUNITY)
Admission: AD | Disposition: A | Discharge status: Home / Self Care | Payer: Self-pay | Source: Ambulatory Visit | Attending: Cardiology

## 2014-07-27 HISTORY — PX: LEFT HEART CATHETERIZATION WITH CORONARY ANGIOGRAM: SHX5451

## 2014-07-27 LAB — CBC
HCT: 35.8 % — ABNORMAL LOW (ref 36.0–46.0)
Hemoglobin: 11.8 g/dL — ABNORMAL LOW (ref 12.0–15.0)
MCH: 26.8 pg (ref 26.0–34.0)
MCHC: 33 g/dL (ref 30.0–36.0)
MCV: 81.2 fL (ref 78.0–100.0)
Platelets: 217 10*3/uL (ref 150–400)
RBC: 4.41 MIL/uL (ref 3.87–5.11)
RDW: 15.8 % — ABNORMAL HIGH (ref 11.5–15.5)
WBC: 5.6 10*3/uL (ref 4.0–10.5)

## 2014-07-27 LAB — HEPARIN LEVEL (UNFRACTIONATED): Heparin Unfractionated: 0.2 IU/mL — ABNORMAL LOW (ref 0.30–0.70)

## 2014-07-27 SURGERY — LEFT HEART CATHETERIZATION WITH CORONARY ANGIOGRAM
Anesthesia: LOCAL

## 2014-07-27 MED ORDER — NITROGLYCERIN 1 MG/10 ML FOR IR/CATH LAB
INTRA_ARTERIAL | Status: AC
  Filled 2014-07-27: qty 10

## 2014-07-27 MED ORDER — HEPARIN (PORCINE) IN NACL 2-0.9 UNIT/ML-% IJ SOLN
INTRAMUSCULAR | Status: AC
  Filled 2014-07-27: qty 1500

## 2014-07-27 MED ORDER — LIDOCAINE HCL (PF) 1 % IJ SOLN
INTRAMUSCULAR | Status: AC
  Filled 2014-07-27: qty 30

## 2014-07-27 MED ORDER — HYDROMORPHONE HCL 1 MG/ML IJ SOLN
INTRAMUSCULAR | Status: AC
  Filled 2014-07-27: qty 1

## 2014-07-27 MED ORDER — VERAPAMIL HCL 2.5 MG/ML IV SOLN
INTRAVENOUS | Status: AC
  Filled 2014-07-27: qty 2

## 2014-07-27 MED ORDER — SODIUM CHLORIDE 0.9 % IV SOLN
1.0000 mL/kg/h | INTRAVENOUS | Status: DC
  Administered 2014-07-27: 1 mL/kg/h via INTRAVENOUS

## 2014-07-27 MED ORDER — MIDAZOLAM HCL 2 MG/2ML IJ SOLN
INTRAMUSCULAR | Status: AC
  Filled 2014-07-27: qty 2

## 2014-07-27 NOTE — Progress Notes (Signed)
Site area: RFA Site Prior to Removal:  Level 0 Pressure Applied For:30 min Manual: yes   Patient Status During Pull:  stable Post Pull Site:  Level 0 Post Pull Instructions Given:  yes Post Pull Pulses Present: palpable Dressing Applied:  clear Bedrest begins @ 1015 Comments:

## 2014-07-27 NOTE — Progress Notes (Signed)
ANTICOAGULATION CONSULT NOTE - Follow Up Consult  Pharmacy Consult for Heparin  Indication: chest pain/ACS  No Known Allergies  Patient Measurements: Height: 5\' 5"  (165.1 cm) Weight: 176 lb 6.4 oz (80.015 kg) IBW/kg (Calculated) : 57  Vital Signs: Temp: 99.2 F (37.3 C) (04/26 2051) Temp Source: Oral (04/26 2051) BP: 124/76 mmHg (04/26 2051) Pulse Rate: 84 (04/26 2051)  Labs:  Recent Labs  07/26/14 1730 07/27/14 0045  HGB 12.7 11.8*  HCT 38.2 35.8*  PLT 227 217  LABPROT 13.8  --   INR 1.05  --   HEPARINUNFRC  --  0.20*  CREATININE 0.79  --   TROPONINI <0.03  --     Estimated Creatinine Clearance: 83 mL/min (by C-G formula based on Cr of 0.79).  Assessment: Sub-therapeutic heparin level x 1 for chest pain, plans for cath per MD note  Goal of Therapy:  Heparin level 0.3-0.7 units/ml Monitor platelets by anticoagulation protocol: Yes   Plan:  -Increase heparin to 1100 units/hr -0800 HL -Daily CBC/HL -Monitor for bleeding  Abran DukeLedford, Karlene Southard 07/27/2014,1:37 AM

## 2014-07-27 NOTE — Progress Notes (Signed)
Pt discharged home with daughter.  Reviewed discharge instructions and education, all questions answered.  Assessment unchanged from earlier.  

## 2014-07-27 NOTE — Interval H&P Note (Signed)
History and Physical Interval Note:  07/27/2014 8:38 AM  [name] [name]  has presented today for surgery, with the diagnosis of cp  The various methods of treatment have been discussed with the patient and family. After consideration of risks, benefits and other options for treatment, the patient has consented to  Procedure(s): LEFT HEART CATHETERIZATION WITH CORONARY ANGIOGRAM (N/A) and possible PCI  as a surgical intervention .  The patient's history has been reviewed, patient examined, no change in status, stable for surgery.  I have reviewed the patient's chart and labs.  Questions were answered to the patient's satisfaction.   Cath Lab Visit (complete for each Cath Lab visit)  Clinical Evaluation Leading to the Procedure:   ACS: Yes.    Non-ACS:    Anginal Classification: CCS IV  Anti-ischemic medical therapy: Minimal Therapy (1 class of medications)  Non-Invasive Test Results: No non-invasive testing performed  Prior CABG: No previous CABG        [facility] LLCGANJI,JAGADEESH [name]

## 2014-07-27 NOTE — CV Procedure (Signed)
Procedure performed:  Left heart catheterization including hemodynamic monitoring of the left ventricle, LV gram, selective right and left coronary arteriography. Ultrasound guided access of the right femoral artery.  Indication patient is a 54 year-old African-American  female with who presents with chest pain, outpatient stress testing had revealed decreased EF and possibility of inferior wall scar with mild peri-infarct ischemia, she presented to the office to discuss the results, complained of rest chest pain, dizziness and not feeling well. Hence she was directly admitted to the hospital yesterday with diagnosis of unstable angina pectoris, she was ruled out for myocardial infarction and this morning was brought to the coronary angiography suite for definitive diagnosis of CAD.   Hemodynamic data:  Left ventricular pressure wa150/9  LVEDP of15 mm mercury. Aortic pressure wa146/79h a mean of104 mm mercury. There was no pressure gradient across the aortic valve.   Left ventricle: Performed in the RAO projection revealed LVEF of 55-60%. There was no wall motion abnormality noted.   Right coronary artery: The vessel is smooth normal. It is dominant. No significant stenosis.  Left main coronary artery is large and normal.   Circumflex coronary artery: A large vessel giving origin to a large obtuse marginal 1.   LAD:  LAD gives origin to a large diagonal 1. Normal  Technique: Initially we attempted to access the right radial artery. The artery was very small, urine with ultrasound access, I was unable to read the access wire. Hence we decided to proceed with right femoral arterial access. Under sterile precautions using a 5 French right femoral arterial access, a 6 French sheath was introduced into the right femoral artery under fluoroscopy guidance. A 5 [place]French multipurpose B2 catheter was advanced into the ascending aorta and then into the left ventricle. Hemodynamics are analysed. LV angiogram    performed in RAO projection. Catheter pulled into the ascending aorta and right coronary artery was cannulated, then left coronary artery was cannulated and angiography was performed in multiple views. The catheter was pulled back into the abdominal aorta and abdominal aortogram was performed.  Catheter exchanged out of the body over J-Wire. NO immediate complications noted. Patient tolerated the procedure well.   Disposition: Will be discharged home today with outpatient follow up.

## 2014-07-27 NOTE — Discharge Summary (Signed)
Physician Discharge Summary  Patient ID: [name] [name] MRN: [id number] DOB/AGE: April 22, 1960 54 y.o.  Admit date: 07/26/2014 Discharge date: 07/27/2014  Primary Discharge Diagnosis Chest pain, non cardiac  Significant Diagnostic Studies: 07/27/2014: Coronary angiogram  Hemodynamic data:  Left ventricular pressure wa150/9 LVEDP of15 mm mercury. Aortic pressure wa146/79h a mean of104 mm mercury. There was no pressure gradient across the aortic valve.   Left ventricle: Performed in the RAO projection revealed LVEF of 55-60%. There was no wall motion abnormality noted.   Right coronary artery: The vessel is smooth normal. It is dominant. No significant stenosis.  Left main coronary artery is large and normal.   Circumflex coronary artery: A large vessel giving origin to a large obtuse marginal 1.   LAD: LAD gives origin to a large diagonal 1. Normal  Hospital Course: Patient admitted to the hospital directly from the office complaining of ongoing chest pain. Patient has had outpatient stress testing which has inferior ischemia and scar with a LVEF 43%. Hence she was admitted to the hospital, with a diagnosis of unstable angina pectoris, she was ruled out for myocardial infarction and underwent coronary angiography the following morning. Coronary arteriogram was normal. Hence felt discharge was appropriate, with evaluation of etiologies for noncardiac chest pain.   Discharge Exam: Blood pressure 124/76, pulse 56, temperature 98.8 F (37.1 C), temperature source Oral, resp. rate 18, height  (1.651 m), weight 88.451 kg (195 lb), SpO2 97 %.   General appearance: alert, cooperative, appears stated age and no distress Resp: clear to auscultation bilaterally Cardio: regular rate and rhythm, S1, S2 normal, no murmur, click, rub or gallop GI: soft, non-tender; bowel sounds normal; no masses,  no organomegaly Extremities: extremities normal, atraumatic, no cyanosis or edema Pulses: 2+  and symmetric Labs:   Lab Results  Component Value Date   WBC 5.6 07/27/2014   HGB 11.8* 07/27/2014   HCT 35.8* 07/27/2014   MCV 81.2 07/27/2014   PLT 217 07/27/2014    Recent Labs Lab 07/26/14 1730  NA 139  K 3.9  CL 102  CO2 24  BUN 12  CREATININE 0.79  CALCIUM 9.4  GLUCOSE 78   Lab Results  Component Value Date   TROPONINI <0.03 07/26/2014    Lipid Panel     Component Value Date/Time   CHOL 262* 07/26/2014 1730   TRIG 56 07/26/2014 1730   HDL 156 07/26/2014 1730   CHOLHDL 1.7 07/26/2014 1730   VLDL 11 07/26/2014 1730   LDLCALC 95 07/26/2014 1730    EKG: normal EKG, normal sinus rhythm, unchanged from previous tracings.    Radiology: No results found.    FOLLOW UP PLANS AND APPOINTMENTS    Medication List    STOP taking these medications        nitroGLYCERIN 0.4 MG SL tablet  Commonly known as:  NITROSTAT      TAKE these medications        MULTI ADULT GUMMIES Chew  Chew 2-3 tablets by mouth daily.     traMADol 50 MG tablet  Commonly known as:  ULTRAM  Take 50 mg by mouth every 6 (six) hours as needed (pain).           Follow-up Information    Follow up with Sherril Croon June Leap, MD.   Specialty:  Cardiology   Why:  keep previous appointment   Contact information:   [address] [place] [address] [phone]        Delrae Rend  R, MD 07/27/2014, 9:33 AM  Pager: [phone] Office: [phone] If no answer: [phone]

## 2014-07-27 NOTE — Discharge Instructions (Signed)
Radial Site Care °Refer to this sheet in the next few weeks. These instructions provide you with information on caring for yourself after your procedure. Your caregiver may also give you more specific instructions. Your treatment has been planned according to current medical practices, but problems sometimes occur. Call your caregiver if you have any problems or questions after your procedure. °HOME CARE INSTRUCTIONS °· You may shower the day after the procedure. Remove the bandage (dressing) and gently wash the site with plain soap and water. Gently pat the site dry. °· Do not apply powder or lotion to the site. °· Do not submerge the affected site in water for 3 to 5 days. °· Inspect the site at least twice daily. °· Do not flex or bend the affected arm for 24 hours. °· No lifting over 5 pounds (2.3 kg) for 5 days after your procedure. °· Do not drive home if you are discharged the same day of the procedure. Have someone else drive you. °· You may drive 24 hours after the procedure unless otherwise instructed by your caregiver. °· Do not operate machinery or power tools for 24 hours. °· A responsible adult should be with you for the first 24 hours after you arrive home. °What to expect: °· Any bruising will usually fade within 1 to 2 weeks. °· Blood that collects in the tissue (hematoma) may be painful to the touch. It should usually decrease in size and tenderness within 1 to 2 weeks. °SEEK IMMEDIATE MEDICAL CARE IF: °· You have unusual pain at the radial site. °· You have redness, warmth, swelling, or pain at the radial site. °· You have drainage (other than a small amount of blood on the dressing). °· You have chills. °· You have a fever or persistent symptoms for more than 72 hours. °· You have a fever and your symptoms suddenly get worse. °· Your arm becomes pale, cool, tingly, or numb. °· You have heavy bleeding from the site. Hold pressure on the site. °Document Released: 04/20/2010 Document Revised:  06/10/2011 Document Reviewed: 04/20/2010 °ExitCare® Patient Information ©2015 [employer]. This information is not intended to replace advice given to you by your health care provider. Make sure you discuss any questions you have with your health care provider. ° °

## 2014-07-28 LAB — POCT ACTIVATED CLOTTING TIME: Activated Clotting Time: 104 seconds

## 2014-08-18 ENCOUNTER — Encounter (HOSPITAL_COMMUNITY): Payer: Self-pay | Admitting: Cardiology

## 2015-02-18 ENCOUNTER — Emergency Department (HOSPITAL_COMMUNITY)
Admission: EM | Admit: 2015-02-18 | Discharge: 2015-02-19 | Disposition: A | Discharge status: Home / Self Care | Payer: Medicaid Other | Attending: Emergency Medicine | Admitting: Emergency Medicine

## 2015-02-18 ENCOUNTER — Emergency Department (HOSPITAL_COMMUNITY): Payer: Medicaid Other

## 2015-02-18 ENCOUNTER — Other Ambulatory Visit: Payer: Self-pay

## 2015-02-18 ENCOUNTER — Encounter (HOSPITAL_COMMUNITY): Payer: Self-pay

## 2015-02-18 DIAGNOSIS — Z9889 Other specified postprocedural states: Secondary | ICD-10-CM | POA: Diagnosis not present

## 2015-02-18 DIAGNOSIS — Y999 Unspecified external cause status: Secondary | ICD-10-CM | POA: Insufficient documentation

## 2015-02-18 DIAGNOSIS — N390 Urinary tract infection, site not specified: Secondary | ICD-10-CM

## 2015-02-18 DIAGNOSIS — Y9389 Activity, other specified: Secondary | ICD-10-CM | POA: Insufficient documentation

## 2015-02-18 DIAGNOSIS — J45909 Unspecified asthma, uncomplicated: Secondary | ICD-10-CM | POA: Insufficient documentation

## 2015-02-18 DIAGNOSIS — S0990XA Unspecified injury of head, initial encounter: Secondary | ICD-10-CM | POA: Insufficient documentation

## 2015-02-18 DIAGNOSIS — S8991XA Unspecified injury of right lower leg, initial encounter: Secondary | ICD-10-CM | POA: Diagnosis not present

## 2015-02-18 DIAGNOSIS — Y9289 Other specified places as the place of occurrence of the external cause: Secondary | ICD-10-CM | POA: Diagnosis not present

## 2015-02-18 DIAGNOSIS — Z79899 Other long term (current) drug therapy: Secondary | ICD-10-CM | POA: Insufficient documentation

## 2015-02-18 DIAGNOSIS — S01411A Laceration without foreign body of right cheek and temporomandibular area, initial encounter: Secondary | ICD-10-CM | POA: Diagnosis not present

## 2015-02-18 DIAGNOSIS — E119 Type 2 diabetes mellitus without complications: Secondary | ICD-10-CM | POA: Insufficient documentation

## 2015-02-18 DIAGNOSIS — Z87891 Personal history of nicotine dependence: Secondary | ICD-10-CM | POA: Insufficient documentation

## 2015-02-18 DIAGNOSIS — W1839XA Other fall on same level, initial encounter: Secondary | ICD-10-CM | POA: Insufficient documentation

## 2015-02-18 DIAGNOSIS — R42 Dizziness and giddiness: Secondary | ICD-10-CM | POA: Diagnosis present

## 2015-02-18 DIAGNOSIS — Z23 Encounter for immunization: Secondary | ICD-10-CM | POA: Diagnosis not present

## 2015-02-18 DIAGNOSIS — I951 Orthostatic hypotension: Secondary | ICD-10-CM | POA: Diagnosis not present

## 2015-02-18 DIAGNOSIS — F419 Anxiety disorder, unspecified: Secondary | ICD-10-CM | POA: Diagnosis not present

## 2015-02-18 DIAGNOSIS — Z8673 Personal history of transient ischemic attack (TIA), and cerebral infarction without residual deficits: Secondary | ICD-10-CM | POA: Diagnosis not present

## 2015-02-18 LAB — BASIC METABOLIC PANEL
Anion gap: 8 (ref 5–15)
BUN: 8 mg/dL (ref 6–20)
CO2: 26 mmol/L (ref 22–32)
Calcium: 9.1 mg/dL (ref 8.9–10.3)
Chloride: 106 mmol/L (ref 101–111)
Creatinine, Ser: 0.88 mg/dL (ref 0.44–1.00)
GFR calc Af Amer: >60 mL/min (ref >60)
GFR calc non Af Amer: >60 mL/min (ref >60)
Glucose, Bld: 146 mg/dL — ABNORMAL HIGH (ref 65–99)
Potassium: 3.5 mmol/L (ref 3.5–5.1)
Sodium: 140 mmol/L (ref 135–145)

## 2015-02-18 LAB — URINE MICROSCOPIC-ADD ON

## 2015-02-18 LAB — CBC
HCT: 36.2 % (ref 36.0–46.0)
Hemoglobin: 11.7 g/dL — ABNORMAL LOW (ref 12.0–15.0)
MCH: 25.3 pg — ABNORMAL LOW (ref 26.0–34.0)
MCHC: 32.3 g/dL (ref 30.0–36.0)
MCV: 78.2 fL (ref 78.0–100.0)
Platelets: 240 10*3/uL (ref 150–400)
RBC: 4.63 MIL/uL (ref 3.87–5.11)
RDW: 16.1 % — ABNORMAL HIGH (ref 11.5–15.5)
WBC: 5.5 10*3/uL (ref 4.0–10.5)

## 2015-02-18 LAB — URINALYSIS, ROUTINE W REFLEX MICROSCOPIC
Bilirubin Urine: NEGATIVE
Glucose, UA: NEGATIVE mg/dL
Hgb urine dipstick: NEGATIVE
Ketones, ur: NEGATIVE mg/dL
Nitrite: NEGATIVE
Protein, ur: NEGATIVE mg/dL
Specific Gravity, Urine: 1.017 (ref 1.005–1.030)
pH: 5.5 (ref 5.0–8.0)

## 2015-02-18 LAB — I-STAT CG4 LACTIC ACID, ED: Lactic Acid, Venous: 2.06 mmol/L (ref 0.5–2.0)

## 2015-02-18 LAB — HEPATIC FUNCTION PANEL
ALT: 15 U/L (ref 14–54)
AST: 24 U/L (ref 15–41)
Albumin: 3.4 g/dL — ABNORMAL LOW (ref 3.5–5.0)
Alkaline Phosphatase: 69 U/L (ref 38–126)
Bilirubin, Direct: 0.2 mg/dL (ref 0.1–0.5)
Indirect Bilirubin: 0.2 mg/dL — ABNORMAL LOW (ref 0.3–0.9)
Total Bilirubin: 0.4 mg/dL (ref 0.3–1.2)
Total Protein: 6.5 g/dL (ref 6.5–8.1)

## 2015-02-18 LAB — I-STAT TROPONIN, ED: Troponin i, poc: 0.01 ng/mL (ref 0.00–0.08)

## 2015-02-18 LAB — CBG MONITORING, ED: Glucose-Capillary: 127 mg/dL — ABNORMAL HIGH (ref 65–99)

## 2015-02-18 MED ORDER — CEPHALEXIN 500 MG PO CAPS: 500.0000 mg | ORAL_CAPSULE | Freq: Three times a day (TID) | ORAL | Status: DC

## 2015-02-18 MED ORDER — DEXTROSE 5 % IV SOLN: 1.0000 g | Freq: Once | INTRAVENOUS | Status: DC

## 2015-02-18 MED ORDER — ONDANSETRON HCL 4 MG/2ML IJ SOLN
4.0000 mg | Freq: Once | INTRAMUSCULAR | Status: AC
  Administered 2015-02-18: 4 mg via INTRAVENOUS
  Filled 2015-02-18: qty 2

## 2015-02-18 MED ORDER — CEPHALEXIN 250 MG PO CAPS
500.0000 mg | ORAL_CAPSULE | Freq: Once | ORAL | Status: AC
  Administered 2015-02-18: 500 mg via ORAL
  Filled 2015-02-18: qty 2

## 2015-02-18 MED ORDER — TETANUS-DIPHTH-ACELL PERTUSSIS 5-2.5-18.5 LF-MCG/0.5 IM SUSP
0.5000 mL | Freq: Once | INTRAMUSCULAR | Status: AC
  Administered 2015-02-18: 0.5 mL via INTRAMUSCULAR
  Filled 2015-02-18: qty 0.5

## 2015-02-18 MED ORDER — SODIUM CHLORIDE 0.9 % IV BOLUS (SEPSIS)
1000.0000 mL | Freq: Once | INTRAVENOUS | Status: AC
  Administered 2015-02-18: 1000 mL via INTRAVENOUS

## 2015-02-18 NOTE — ED Notes (Signed)
Pt here for lightheaded and dizzy followed by fall after taking prazosin and coreg, report theses meds make her dizzy but has never fallen before. Pt has laceration to cheek. And complains of head, right face and right knee pain.

## 2015-02-18 NOTE — Discharge Instructions (Signed)
Take keflex three times daily for 5 days.   Stay hydrated.   Stop taking cozaar, lasix, coreg. Consider stopping prazosin as it may affect your blood pressure as well.   Recheck blood pressure with your doctor in a week.   Return to ER if you pass out, chest pain, abdominal pain, fevers, vomiting.

## 2015-02-18 NOTE — ED Provider Notes (Signed)
CSN: [id number]     Arrival date & time 02/18/15  1952 History   First MD Initiated Contact with Patient 02/18/15 2007     Chief Complaint  Patient presents with  . Fall  . Dizziness     (Consider location/radiation/quality/duration/timing/severity/associated sxs/prior Treatment) The history is provided by the patient.  [name] [name] is a 54 y.o. female hx of anxiety, stroke, diabetes here presenting with dizziness, fall. Patient states that she took her prozosin and carvedilol tonight before bed. About 30 minutes later, she woke up to go to the bathroom. She had a bowel movement and urinated and then suddenly felt lightheaded and dizzy and passed out. She hit the right side of her face and head. She complained of right cheek pain, headaches, R knee pain. Was noted to be hypotensive in triage. Has been on these meds for years. Not on blood thinners.     Past Medical History  Diagnosis Date  . Anxiety   . Asthma   . Blood transfusion without reported diagnosis   . Stroke [facility]) 2010    TIA  . Diabetes mellitus without complication (HCC)   . Vitamin D deficiency    Past Surgical History  Procedure Laterality Date  . Gastric bypass  2007  . Abdominal hysterectomy    . Left heart catheterization with coronary angiogram N/A 07/27/2014    Procedure: LEFT HEART CATHETERIZATION WITH CORONARY ANGIOGRAM;  Surgeon: [name], MD;  Location: [facility] CATH LAB;  Service: Cardiovascular;  Laterality: N/A;   History reviewed. No pertinent family history. Social History  Substance Use Topics  . Smoking status: Former Smoker    Quit date: 04/26/1977  . Smokeless tobacco: Never Used  . Alcohol Use: Yes     Comment: quit 90 days ago       " had a drink yesterday "   OB History    Gravida Para Term Preterm AB TAB SAB Ectopic Multiple Living   0 0 0 0 0 0 0 0       Review of Systems  Neurological: Positive for syncope.  All other systems reviewed and are negative.     Allergies   Codeine  Home Medications   Prior to Admission medications   Medication Sig Start Date End Date Taking? Authorizing Provider  carvedilol (COREG) 3.125 MG tablet Take 3.125 mg by mouth 2 (two) times daily with a meal.   Yes Historical Provider, MD  FLUoxetine (PROZAC) 40 MG capsule Take 40 mg by mouth daily.   Yes Historical Provider, MD  furosemide (LASIX) 20 MG tablet Take 20 mg by mouth daily.   Yes Historical Provider, MD  losartan (COZAAR) 25 MG tablet Take 25 mg by mouth daily.   Yes Historical Provider, MD  meclizine (ANTIVERT) 50 MG tablet Take 0.5 tablets (25 mg total) by mouth 3 (three) times daily as needed for dizziness. Patient not taking: Reported on 02/18/2015 07/19/14   Earley Favor, NP  Multiple Vitamins-Minerals (MULTI ADULT GUMMIES) CHEW Chew 1-3 tablets by mouth daily.    Yes Historical Provider, MD  prazosin (MINIPRESS) 5 MG capsule Take 5 mg by mouth at bedtime.   Yes Historical Provider, MD   BP 97/63 mmHg  Pulse 55  Temp(Src) 98.6 F (37 C) (Oral)  Resp 17  Ht  (1.651 m)  Wt 195 lb (88.451 kg)  BMI 32.45 kg/m2  SpO2 100% Physical Exam  Constitutional: She is oriented to person, place, and time.  Uncomfortable, vomiting   HENT:  Head: Normocephalic.  3 cm linear laceration R cheek, no bony tenderness   Eyes: Conjunctivae are normal. Pupils are equal, round, and reactive to light.  Neck: Normal range of motion. Neck supple.  Cardiovascular: Normal rate, regular rhythm and normal heart sounds.   Pulmonary/Chest: Effort normal and breath sounds normal. No respiratory distress. She has no wheezes. She has no rales.  Abdominal: Soft. Bowel sounds are normal. She exhibits no distension. There is no tenderness. There is no rebound.  No obvious pulsatile mass   Musculoskeletal: Normal range of motion. She exhibits no edema or tenderness.  Neurological: She is alert and oriented to person, place, and time. No cranial nerve deficit. Coordination normal.  Skin:  Skin is warm and dry.  Psychiatric: She has a normal mood and affect. Her behavior is normal. Judgment and thought content normal.  Nursing note and vitals reviewed.   ED Course  Procedures (including critical care time)  LACERATION REPAIR Performed by: Chaney MallingYAO, Katurah Karapetian Authorized by: Chaney MallingYAO, Rileyann Florance Consent: Verbal consent obtained. Risks and benefits: risks, benefits and alternatives were discussed Consent given by: patient Patient identity confirmed: provided demographic data Prepped and Draped in normal sterile fashion Wound explored  Laceration Location: R face  Laceration Length: 2 cm  No Foreign Bodies seen or palpated  Anesthesia: local infiltration  Local anesthetic: none   Irrigation method: syringe Amount of cleaning: standard  Skin closure: dermabond  Patient tolerance: Patient tolerated the procedure well with no immediate complications.   Labs Review Labs Reviewed  BASIC METABOLIC PANEL - Abnormal; Notable for the following:    Glucose, Bld 146 (*)    All other components within normal limits  CBC - Abnormal; Notable for the following:    Hemoglobin 11.7 (*)    MCH 25.3 (*)    RDW 16.1 (*)    All other components within normal limits  URINALYSIS, ROUTINE W REFLEX MICROSCOPIC (NOT AT [facility]) - Abnormal; Notable for the following:    APPearance CLOUDY (*)    Leukocytes, UA LARGE (*)    All other components within normal limits  HEPATIC FUNCTION PANEL - Abnormal; Notable for the following:    Albumin 3.4 (*)    Indirect Bilirubin 0.2 (*)    All other components within normal limits  URINE MICROSCOPIC-ADD ON - Abnormal; Notable for the following:    Squamous Epithelial / LPF 6-30 (*)    Bacteria, UA RARE (*)    Casts HYALINE CASTS (*)    All other components within normal limits  CBG MONITORING, ED - Abnormal; Notable for the following:    Glucose-Capillary 127 (*)    All other components within normal limits  I-STAT CG4 LACTIC ACID, ED - Abnormal; Notable  for the following:    Lactic Acid, Venous 2.06 (*)    All other components within normal limits  I-STAT TROPOININ, ED    Imaging Review Dg Chest 2 View  02/18/2015  CLINICAL DATA:  Fall in her bathroom today. EXAM: CHEST  2 VIEW COMPARISON:  03/05/2014 FINDINGS: The cardiomediastinal contours are normal. The lungs are hypo aerated but clear. Pulmonary vasculature is normal. No consolidation, pleural effusion, or pneumothorax. No acute osseous abnormalities are seen. Surgical clips in the left upper quadrant. IMPRESSION: No acute pulmonary process. Electronically Signed   By: Rubye OaksMelanie  Ehinger M.D.   On: 02/18/2015 21:46   Ct Head Wo Contrast  02/18/2015  CLINICAL DATA:  Larey SeatFell and hit right-sided head. Right-sided laceration and hematoma. EXAM: CT HEAD WITHOUT CONTRAST  CT MAXILLOFACIAL WITHOUT CONTRAST CT CERVICAL SPINE WITHOUT CONTRAST TECHNIQUE: Multidetector CT imaging of the head, cervical spine, and maxillofacial structures were performed using the standard protocol without intravenous contrast. Multiplanar CT image reconstructions of the cervical spine and maxillofacial structures were also generated. COMPARISON:  None. FINDINGS: CT HEAD FINDINGS Sinuses/Soft tissues: Minimal fluid in the sphenoid sinus. Hypoplastic left frontal sinus. No skull fracture. Intracranial: No mass lesion, hemorrhage, hydrocephalus, acute infarct, intra-axial, or extra-axial fluid collection. CT MAXILLOFACIAL FINDINGS Soft tissues: Soft tissue swelling about the right zygoma is mild. Normal appearance of the orbits and globes. Bones: Zygomatic arches intact. Mandibular condyles located. Pterygoid plates intact. Clear mastoid air cells. Orbital floors intact on coronal reformats. CT CERVICAL SPINE FINDINGS Spinal visualization through the bottom of T4. Prevertebral soft tissues are within normal limits. No apical pneumothorax. Skull base intact. Maintenance of vertebral body height. Straightening of expected cervical  lordosis. Endplate osteophytes at C5-6. Facets are well-aligned. Coronal reformats demonstrate a normal C1-C2 articulation. IMPRESSION: 1. Right facial soft tissue swelling, without acute fracture. 2.  No acute intracranial abnormality. 3. No fracture or subluxation within the cervical spine. Straightening of expected cervical lordosis could be positional, due to muscular spasm, or ligamentous injury. 4. Fluid in the sphenoid sinus, without cause. Favor related to sinusitis rather than facial trauma. Electronically Signed   By: Jeronimo Greaves M.D.   On: 02/18/2015 21:51   Ct Cervical Spine Wo Contrast  02/18/2015  CLINICAL DATA:  Larey Seat and hit right-sided head. Right-sided laceration and hematoma. EXAM: CT HEAD WITHOUT CONTRAST CT MAXILLOFACIAL WITHOUT CONTRAST CT CERVICAL SPINE WITHOUT CONTRAST TECHNIQUE: Multidetector CT imaging of the head, cervical spine, and maxillofacial structures were performed using the standard protocol without intravenous contrast. Multiplanar CT image reconstructions of the cervical spine and maxillofacial structures were also generated. COMPARISON:  None. FINDINGS: CT HEAD FINDINGS Sinuses/Soft tissues: Minimal fluid in the sphenoid sinus. Hypoplastic left frontal sinus. No skull fracture. Intracranial: No mass lesion, hemorrhage, hydrocephalus, acute infarct, intra-axial, or extra-axial fluid collection. CT MAXILLOFACIAL FINDINGS Soft tissues: Soft tissue swelling about the right zygoma is mild. Normal appearance of the orbits and globes. Bones: Zygomatic arches intact. Mandibular condyles located. Pterygoid plates intact. Clear mastoid air cells. Orbital floors intact on coronal reformats. CT CERVICAL SPINE FINDINGS Spinal visualization through the bottom of T4. Prevertebral soft tissues are within normal limits. No apical pneumothorax. Skull base intact. Maintenance of vertebral body height. Straightening of expected cervical lordosis. Endplate osteophytes at C5-6. Facets are  well-aligned. Coronal reformats demonstrate a normal C1-C2 articulation. IMPRESSION: 1. Right facial soft tissue swelling, without acute fracture. 2.  No acute intracranial abnormality. 3. No fracture or subluxation within the cervical spine. Straightening of expected cervical lordosis could be positional, due to muscular spasm, or ligamentous injury. 4. Fluid in the sphenoid sinus, without cause. Favor related to sinusitis rather than facial trauma. Electronically Signed   By: Jeronimo Greaves M.D.   On: 02/18/2015 21:51   Dg Knee Complete 4 Views Right  02/18/2015  CLINICAL DATA:  Fall in her bathroom tonight, right knee pain. Pain laterally. EXAM: RIGHT KNEE - COMPLETE 4+ VIEW COMPARISON:  None. FINDINGS: No acute fracture or dislocation. Mild medial tibial femoral joint space narrowing and peripheral osteophytes. Small quadriceps and patellar tendon enthesophytes. No joint effusion or focal soft tissue abnormality. IMPRESSION: No fracture or dislocation of the right knee. Mild degenerative change. Electronically Signed   By: Rubye Oaks M.D.   On: 02/18/2015 21:48   Ct Maxillofacial Wo  Cm  02/18/2015  CLINICAL DATA:  Larey Seat and hit right-sided head. Right-sided laceration and hematoma. EXAM: CT HEAD WITHOUT CONTRAST CT MAXILLOFACIAL WITHOUT CONTRAST CT CERVICAL SPINE WITHOUT CONTRAST TECHNIQUE: Multidetector CT imaging of the head, cervical spine, and maxillofacial structures were performed using the standard protocol without intravenous contrast. Multiplanar CT image reconstructions of the cervical spine and maxillofacial structures were also generated. COMPARISON:  None. FINDINGS: CT HEAD FINDINGS Sinuses/Soft tissues: Minimal fluid in the sphenoid sinus. Hypoplastic left frontal sinus. No skull fracture. Intracranial: No mass lesion, hemorrhage, hydrocephalus, acute infarct, intra-axial, or extra-axial fluid collection. CT MAXILLOFACIAL FINDINGS Soft tissues: Soft tissue swelling about the right zygoma  is mild. Normal appearance of the orbits and globes. Bones: Zygomatic arches intact. Mandibular condyles located. Pterygoid plates intact. Clear mastoid air cells. Orbital floors intact on coronal reformats. CT CERVICAL SPINE FINDINGS Spinal visualization through the bottom of T4. Prevertebral soft tissues are within normal limits. No apical pneumothorax. Skull base intact. Maintenance of vertebral body height. Straightening of expected cervical lordosis. Endplate osteophytes at C5-6. Facets are well-aligned. Coronal reformats demonstrate a normal C1-C2 articulation. IMPRESSION: 1. Right facial soft tissue swelling, without acute fracture. 2.  No acute intracranial abnormality. 3. No fracture or subluxation within the cervical spine. Straightening of expected cervical lordosis could be positional, due to muscular spasm, or ligamentous injury. 4. Fluid in the sphenoid sinus, without cause. Favor related to sinusitis rather than facial trauma. Electronically Signed   By: Jeronimo Greaves M.D.   On: 02/18/2015 21:51   I have personally reviewed and evaluated these images and lab results as part of my medical decision-making.   EKG Interpretation None      MDM   Final diagnoses:  None   [name] [name] is a 54 y.o. female here with dizziness, hypotension. Can be from taking BP meds. No fever to suggest sepsis. Abdomen nontender and I doubt AAA. Will get labs, CT head/neck/face, xrays, hydrate and reassess.   11:24 PM  Patient was orthostatic initially. Given 2 L NS. UA ? UTI. Laceration dermabonded. Updated tdap. Given keflex for UTI. BP now up to 101/63. Still can't stand well. I prefer to admit for dehydration, persistent orthostasis, but patient wants to go home. Will dc coreg, lasix, cozaar for now.    Richardean Canal, MD 02/18/15 [phone]

## 2015-02-20 LAB — URINE CULTURE: Culture: >=100000

## 2015-02-21 ENCOUNTER — Telehealth (HOSPITAL_BASED_OUTPATIENT_CLINIC_OR_DEPARTMENT_OTHER): Payer: Self-pay | Admitting: Emergency Medicine

## 2015-02-21 NOTE — Telephone Encounter (Signed)
Post ED Visit - Positive Culture Follow-up  Culture report reviewed by antimicrobial stewardship pharmacist:  []  Enzo BiNathan Batchelder, Pharm.D. []  Celedonio MiyamotoJeremy Frens, Pharm.D., BCPS []  Garvin FilaMike Maccia, Pharm.D. []  Georgina PillionElizabeth Martin, Pharm.D., BCPS []  WashingtonMinh Pham, [address]Pharm[address]D., BCPS, AAHIVP []  Estella HuskMichelle Turner, Pharm.D., BCPS, AAHIVP []  Tennis Mustassie Stewart, Pharm.D. [x]  Sherle Poeob Vincent, [address]Pharm[address]  Positive urine culture Group B strep Treated with cephalexin, organism sensitive to the same and no further patient follow-up is required at this time.  Berle MullMiller, See [place] 02/21/2015, 9:48 AM

## 2015-05-31 ENCOUNTER — Other Ambulatory Visit (HOSPITAL_COMMUNITY): Payer: Self-pay | Admitting: Obstetrics

## 2015-05-31 DIAGNOSIS — N950 Postmenopausal bleeding: Secondary | ICD-10-CM

## 2015-05-31 LAB — PROCEDURE REPORT - SCANNED: Pap: NEGATIVE

## 2015-06-05 ENCOUNTER — Other Ambulatory Visit: Payer: Self-pay

## 2015-06-05 DIAGNOSIS — Z1231 Encounter for screening mammogram for malignant neoplasm of breast: Secondary | ICD-10-CM

## 2015-06-07 ENCOUNTER — Ambulatory Visit (HOSPITAL_COMMUNITY): Payer: Medicaid Other

## 2015-06-08 ENCOUNTER — Ambulatory Visit (HOSPITAL_COMMUNITY)
Admission: RE | Admit: 2015-06-08 | Discharge: 2015-06-08 | Disposition: A | Discharge status: Home / Self Care | Payer: Medicaid Other | Source: Ambulatory Visit | Attending: Obstetrics | Admitting: Obstetrics

## 2015-06-08 DIAGNOSIS — N950 Postmenopausal bleeding: Secondary | ICD-10-CM | POA: Diagnosis not present

## 2015-06-12 ENCOUNTER — Other Ambulatory Visit: Payer: Self-pay | Admitting: Obstetrics

## 2015-06-20 ENCOUNTER — Ambulatory Visit
Admission: RE | Admit: 2015-06-20 | Discharge: 2015-06-20 | Disposition: A | Discharge status: Home / Self Care | Payer: Medicaid Other | Source: Ambulatory Visit

## 2015-06-20 DIAGNOSIS — Z1231 Encounter for screening mammogram for malignant neoplasm of breast: Secondary | ICD-10-CM

## 2015-07-04 NOTE — Patient Instructions (Addendum)
Your procedure is scheduled on:  Wednesday, July 12, 2015  [facility]:  10:45 AM  Pick up the phone at the desk and dial [phone].  Call this number if you have problems the morning of surgery: [phone].  Remember: Do NOT eat food:  After Midnight Tuesday  Do NOT drink clear liquids after:  8:00 AM day of surgery  Take these medicines the morning of surgery with a SIP OF WATER:  Carvedilol, Prozac, Wellbutrin  Do NOT wear jewelry (body piercing), metal hair clips/bobby pins, make-up, or nail polish. Do NOT wear lotions, powders, or perfumes.  You may wear deodorant. Do NOT shave for 48 hours prior to surgery. Do NOT bring valuables to the hospital. Contacts, dentures, or bridgework may not be worn into surgery.  Have a responsible adult drive you home and stay with you for 24 hours after your procedure

## 2015-07-06 ENCOUNTER — Encounter (HOSPITAL_COMMUNITY): Payer: Self-pay

## 2015-07-06 ENCOUNTER — Encounter (HOSPITAL_COMMUNITY)
Admission: RE | Admit: 2015-07-06 | Discharge: 2015-07-06 | Disposition: A | Discharge status: Home / Self Care | Payer: Medicaid Other | Source: Ambulatory Visit | Attending: Obstetrics | Admitting: Obstetrics

## 2015-07-06 DIAGNOSIS — N950 Postmenopausal bleeding: Secondary | ICD-10-CM | POA: Insufficient documentation

## 2015-07-06 DIAGNOSIS — N882 Stricture and stenosis of cervix uteri: Secondary | ICD-10-CM | POA: Insufficient documentation

## 2015-07-06 HISTORY — DX: Anemia, unspecified: D64.9

## 2015-07-06 HISTORY — DX: Syncope and collapse: R55

## 2015-07-06 HISTORY — DX: Unspecified osteoarthritis, unspecified site: M19.90

## 2015-07-06 HISTORY — DX: Heart failure, unspecified: I50.9

## 2015-07-06 HISTORY — DX: Reserved for inherently not codable concepts without codable children: IMO0001

## 2015-07-06 HISTORY — DX: Sleep terrors (night terrors): F51.4

## 2015-07-06 HISTORY — DX: Dizziness and giddiness: R42

## 2015-07-06 HISTORY — DX: Angina pectoris, unspecified: I20.9

## 2015-07-06 HISTORY — DX: Other cervical disc displacement, unspecified cervical region: M50.20

## 2015-07-06 LAB — BASIC METABOLIC PANEL
Anion gap: 8 (ref 5–15)
BUN: 18 mg/dL (ref 6–20)
CO2: 23 mmol/L (ref 22–32)
Calcium: 9 mg/dL (ref 8.9–10.3)
Chloride: 104 mmol/L (ref 101–111)
Creatinine, Ser: 0.82 mg/dL (ref 0.44–1.00)
GFR calc Af Amer: >60 mL/min (ref >60)
GFR calc non Af Amer: >60 mL/min (ref >60)
Glucose, Bld: 104 mg/dL — ABNORMAL HIGH (ref 65–99)
Potassium: 4.1 mmol/L (ref 3.5–5.1)
Sodium: 135 mmol/L (ref 135–145)

## 2015-07-06 LAB — CBC
HCT: 34.8 % — ABNORMAL LOW (ref 36.0–46.0)
Hemoglobin: 11.4 g/dL — ABNORMAL LOW (ref 12.0–15.0)
MCH: 24.4 pg — ABNORMAL LOW (ref 26.0–34.0)
MCHC: 32.8 g/dL (ref 30.0–36.0)
MCV: 74.4 fL — ABNORMAL LOW (ref 78.0–100.0)
Platelets: 244 10*3/uL (ref 150–400)
RBC: 4.68 MIL/uL (ref 3.87–5.11)
RDW: 15.9 % — ABNORMAL HIGH (ref 11.5–15.5)
WBC: 5.4 10*3/uL (ref 4.0–10.5)
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
## 2015-07-12 ENCOUNTER — Ambulatory Visit (HOSPITAL_COMMUNITY)
Admission: RE | Admit: 2015-07-12 | Discharge: 2015-07-12 | Disposition: A | Discharge status: Home / Self Care | Payer: Medicaid Other | Source: Ambulatory Visit | Attending: Obstetrics | Admitting: Obstetrics

## 2015-07-12 ENCOUNTER — Ambulatory Visit (HOSPITAL_COMMUNITY): Payer: Medicaid Other | Admitting: Anesthesiology

## 2015-07-12 ENCOUNTER — Encounter (HOSPITAL_COMMUNITY): Payer: Self-pay | Admitting: Anesthesiology

## 2015-07-12 ENCOUNTER — Encounter (HOSPITAL_COMMUNITY)
Admission: RE | Disposition: A | Discharge status: Home / Self Care | Payer: Self-pay | Source: Ambulatory Visit | Attending: Obstetrics

## 2015-07-12 DIAGNOSIS — F419 Anxiety disorder, unspecified: Secondary | ICD-10-CM | POA: Diagnosis not present

## 2015-07-12 DIAGNOSIS — N950 Postmenopausal bleeding: Secondary | ICD-10-CM | POA: Insufficient documentation

## 2015-07-12 DIAGNOSIS — J45909 Unspecified asthma, uncomplicated: Secondary | ICD-10-CM | POA: Insufficient documentation

## 2015-07-12 DIAGNOSIS — I509 Heart failure, unspecified: Secondary | ICD-10-CM | POA: Insufficient documentation

## 2015-07-12 DIAGNOSIS — Z87891 Personal history of nicotine dependence: Secondary | ICD-10-CM | POA: Diagnosis not present

## 2015-07-12 HISTORY — PX: HYSTEROSCOPY WITH D & C: SHX1775

## 2015-07-12 SURGERY — DILATATION AND CURETTAGE /HYSTEROSCOPY
Anesthesia: Monitor Anesthesia Care | Site: Vagina

## 2015-07-12 MED ORDER — DEXAMETHASONE SODIUM PHOSPHATE 10 MG/ML IJ SOLN
INTRAMUSCULAR | Status: AC
  Filled 2015-07-12: qty 1

## 2015-07-12 MED ORDER — MIDAZOLAM HCL 5 MG/5ML IJ SOLN
INTRAMUSCULAR | Status: DC | PRN
  Administered 2015-07-12: 2 mg via INTRAVENOUS

## 2015-07-12 MED ORDER — LIDOCAINE HCL 1 % IJ SOLN
INTRAMUSCULAR | Status: DC | PRN
  Administered 2015-07-12: 10 mL

## 2015-07-12 MED ORDER — DEXAMETHASONE SODIUM PHOSPHATE 4 MG/ML IJ SOLN
INTRAMUSCULAR | Status: DC | PRN
  Administered 2015-07-12: 10 mg via INTRAVENOUS

## 2015-07-12 MED ORDER — LACTATED RINGERS IV SOLN
INTRAVENOUS | Status: DC
  Administered 2015-07-12 (×2): via INTRAVENOUS

## 2015-07-12 MED ORDER — ONDANSETRON HCL 4 MG/2ML IJ SOLN
INTRAMUSCULAR | Status: AC
  Filled 2015-07-12: qty 2

## 2015-07-12 MED ORDER — LIDOCAINE HCL (CARDIAC) 20 MG/ML IV SOLN
INTRAVENOUS | Status: DC | PRN
  Administered 2015-07-12: 100 mg via INTRAVENOUS

## 2015-07-12 MED ORDER — KETOROLAC TROMETHAMINE 30 MG/ML IJ SOLN
INTRAMUSCULAR | Status: DC | PRN
  Administered 2015-07-12: 30 mg via INTRAVENOUS

## 2015-07-12 MED ORDER — MIDAZOLAM HCL 2 MG/2ML IJ SOLN
INTRAMUSCULAR | Status: AC
  Filled 2015-07-12: qty 2

## 2015-07-12 MED ORDER — MEPERIDINE HCL 25 MG/ML IJ SOLN: 6.2500 mg | INTRAMUSCULAR | Status: DC | PRN

## 2015-07-12 MED ORDER — FENTANYL CITRATE (PF) 100 MCG/2ML IJ SOLN
INTRAMUSCULAR | Status: DC | PRN
  Administered 2015-07-12 (×2): 50 ug via INTRAVENOUS

## 2015-07-12 MED ORDER — FENTANYL CITRATE (PF) 100 MCG/2ML IJ SOLN
INTRAMUSCULAR | Status: AC
  Filled 2015-07-12: qty 2

## 2015-07-12 MED ORDER — LIDOCAINE HCL (CARDIAC) 20 MG/ML IV SOLN
INTRAVENOUS | Status: AC
  Filled 2015-07-12: qty 5

## 2015-07-12 MED ORDER — SODIUM CHLORIDE 0.9 % IR SOLN
Status: DC | PRN
  Administered 2015-07-12: 3000 mL

## 2015-07-12 MED ORDER — KETOROLAC TROMETHAMINE 30 MG/ML IJ SOLN
INTRAMUSCULAR | Status: AC
  Filled 2015-07-12: qty 1

## 2015-07-12 MED ORDER — PROPOFOL 10 MG/ML IV BOLUS
INTRAVENOUS | Status: DC | PRN
  Administered 2015-07-12: 200 mg via INTRAVENOUS

## 2015-07-12 MED ORDER — HYDROMORPHONE HCL 1 MG/ML IJ SOLN
INTRAMUSCULAR | Status: DC | PRN
  Administered 2015-07-12: 1 mg via INTRAVENOUS

## 2015-07-12 MED ORDER — ONDANSETRON HCL 4 MG/2ML IJ SOLN
INTRAMUSCULAR | Status: DC | PRN
  Administered 2015-07-12: 4 mg via INTRAVENOUS

## 2015-07-12 MED ORDER — OXYCODONE HCL 5 MG/5ML PO SOLN
5.0000 mg | Freq: Once | ORAL | Status: DC | PRN
Start: 2015-07-12 — End: 2015-07-12

## 2015-07-12 MED ORDER — HYDROMORPHONE HCL 1 MG/ML IJ SOLN
0.2500 mg | INTRAMUSCULAR | Status: DC | PRN
  Administered 2015-07-12: 0.5 mg via INTRAVENOUS

## 2015-07-12 MED ORDER — HYDROMORPHONE HCL 1 MG/ML IJ SOLN
INTRAMUSCULAR | Status: AC
  Filled 2015-07-12: qty 1

## 2015-07-12 MED ORDER — SCOPOLAMINE 1 MG/3DAYS TD PT72
1.0000 | MEDICATED_PATCH | Freq: Once | TRANSDERMAL | Status: DC
  Administered 2015-07-12: 1.5 mg via TRANSDERMAL

## 2015-07-12 MED ORDER — PROPOFOL 10 MG/ML IV BOLUS
INTRAVENOUS | Status: AC
Start: 2015-07-12 — End: 2015-07-12
  Filled 2015-07-12: qty 20

## 2015-07-12 MED ORDER — LIDOCAINE HCL 1 % IJ SOLN
INTRAMUSCULAR | Status: AC
  Filled 2015-07-12: qty 20

## 2015-07-12 MED ORDER — OXYCODONE HCL 5 MG PO TABS: 5.0000 mg | ORAL_TABLET | Freq: Once | ORAL | Status: DC | PRN

## 2015-07-12 SURGICAL SUPPLY — 18 items
CANISTER SUCT 3000ML (MISCELLANEOUS) ×2 IMPLANT
CATH ROBINSON RED A/P 16FR (CATHETERS) ×2 IMPLANT
CLOTH BEACON ORANGE TIMEOUT ST (SAFETY) ×2 IMPLANT
CONTAINER PREFILL 10% NBF 60ML (FORM) ×2 IMPLANT
ELECT REM PT RETURN 9FT ADLT (ELECTROSURGICAL)
ELECTRODE REM PT RTRN 9FT ADLT (ELECTROSURGICAL) IMPLANT
GLOVE BIO SURGEON STRL SZ8.5 (GLOVE) ×2 IMPLANT
GLOVE BIOGEL PI IND STRL 7.0 (GLOVE) ×1 IMPLANT
GLOVE BIOGEL PI INDICATOR 7.0 (GLOVE) ×1
GOWN STRL REUS W/TWL 2XL LVL3 (GOWN DISPOSABLE) ×2 IMPLANT
GOWN STRL REUS W/TWL LRG LVL3 (GOWN DISPOSABLE) ×2 IMPLANT
LOOP ANGLED CUTTING 22FR (CUTTING LOOP) IMPLANT
PACK VAGINAL MINOR WOMEN LF (CUSTOM PROCEDURE TRAY) ×2 IMPLANT
PAD OB MATERNITY 4.3X12.25 (PERSONAL CARE ITEMS) ×2 IMPLANT
TOWEL OR 17X24 6PK STRL BLUE (TOWEL DISPOSABLE) ×4 IMPLANT
TUBING AQUILEX INFLOW (TUBING) ×2 IMPLANT
TUBING AQUILEX OUTFLOW (TUBING) ×2 IMPLANT
WATER STERILE IRR 1000ML POUR (IV SOLUTION) ×2 IMPLANT

## 2015-07-12 NOTE — Op Note (Signed)
Preop diagnosis postmenopausal bleeding Postop diagnosis same Anesthesia general Surgeon Dr. Francoise CeoBernard [name] [name] Procedure on the general anesthesia perineum and vagina prepped and draped bladder emptied with a straight catheter bimanual exam revealed the uterus to be top normal size speculum placed in the vagina cervix grasped the injected with 10 cc 1% Xylocaine anterior lip of the cervix grasped tenaculum the endometrial metria cavity sounded 8 cm cervix dilated to #25 Pratt hysteroscope inserted the cavity was atrophic hysteroscope removed a sharp curettage performed a small amount of tissue obtained patient tolerated the procedure well

## 2015-07-12 NOTE — Discharge Instructions (Signed)
DISCHARGE INSTRUCTIONS: HYSTEROSCOPY / ENDOMETRIAL ABLATION The following instructions have been prepared to help you care for yourself upon your return home.  May Remove Scop patch on or before  07/15/15  May take Ibuprofen after 6:15 pm as needed for cramps/pain.  Personal hygiene:  Use sanitary pads for vaginal drainage, not tampons.  Shower the day after your procedure.  NO tub baths, pools or Jacuzzis for 2-3 weeks.  Wipe front to back after using the bathroom.  Activity and limitations:  Do NOT drive or operate any equipment for 24 hours. The effects of anesthesia are still present and drowsiness may result.  Do NOT rest in bed all day.  Walking is encouraged.  Walk up and down stairs slowly.  You may resume your normal activity in one to two days or as indicated by your physician.  Sexual activity: NO intercourse for at least 2 weeks after the procedure, or as indicated by your Doctor.  Diet: Eat a light meal as desired this evening. You may resume your usual diet tomorrow.  Return to Work: You may resume your work activities in one to two days or as indicated by Therapist, sportsyour Doctor.  What to expect after your surgery: Expect to have vaginal bleeding/discharge for 2-3 days and spotting for up to 10 days. It is not unusual to have soreness for up to 1-2 weeks. You may have a slight burning sensation when you urinate for the first day. Mild cramps may continue for a couple of days. You may have a regular period in 2-6 weeks.  Call your doctor for any of the following:  Excessive vaginal bleeding or clotting, saturating and changing one pad every hour.  Inability to urinate 6 hours after discharge from hospital.  Pain not relieved by pain medication.  Fever of 100.4 F or greater.  Unusual vaginal discharge or odor.

## 2015-07-12 NOTE — Anesthesia Procedure Notes (Signed)
Procedure Name: LMA Insertion Date/Time: 07/12/2015 12:07 PM Performed by: Junious SilkGILBERT, Coston Mandato Pre-anesthesia Checklist: Patient identified, Emergency Drugs available, Suction available, Patient being monitored and Timeout performed Patient Re-evaluated:Patient Re-evaluated prior to inductionOxygen Delivery Method: Circle system utilized Preoxygenation: Pre-oxygenation with 100% oxygen Intubation Type: IV induction Ventilation: Mask ventilation without difficulty LMA: LMA inserted LMA Size: 4.0 Number of attempts: 1 Placement Confirmation: positive ETCO2,  CO2 detector and breath sounds checked- equal and bilateral Tube secured with: Tape Dental Injury: Teeth and Oropharynx as per pre-operative assessment

## 2015-07-12 NOTE — Anesthesia Postprocedure Evaluation (Signed)
Anesthesia Post Note  Patient: [name] [name]  Procedure(s) Performed: Procedure(s) (LRB): DILATATION AND CURETTAGE /HYSTEROSCOPY (N/A)  Patient location during evaluation: PACU Anesthesia Type: General Level of consciousness: sedated and patient cooperative Pain management: pain level controlled Vital Signs Assessment: post-procedure vital signs reviewed and stable Respiratory status: spontaneous breathing Cardiovascular status: stable Anesthetic complications: no    Last Vitals:  Filed Vitals:   07/12/15 1315 07/12/15 1330  BP: 127/78 136/82  Pulse: 57 59  Temp:    Resp: 16 14    Last Pain:  Filed Vitals:   07/12/15 1335  PainSc: Asleep                 Lewie LoronJohn Ricky Gallery

## 2015-07-12 NOTE — Anesthesia Preprocedure Evaluation (Addendum)
Anesthesia Evaluation  Patient identified by MRN, date of birth, ID band Patient awake    Reviewed: Allergy & Precautions, NPO status , Patient's Chart, lab work & pertinent test results  Airway Mallampati: II  TM Distance: >3 FB Neck ROM: Full    Dental no notable dental hx.    Pulmonary shortness of breath, asthma , former smoker,    Pulmonary exam normal breath sounds clear to auscultation       Cardiovascular + angina with exertion +CHF  Normal cardiovascular exam Rhythm:Regular Rate:Normal  Left ventricle: Performed in the RAO projection revealed LVEF of 55-60%. There was no wall motion abnormality noted. Right coronary artery: The vessel is smooth normal. It is dominant. No significant stenosis.Left main coronary artery is large and normal. Circumflex coronary artery: A large vessel giving origin to a large obtuse marginal 1. LAD: LAD gives origin to a large diagonal 1. Normal  Hospital Course: Patient admitted to the hospital directly from the office complaining of ongoing chest pain. Patient has had outpatient stress testing which has inferior ischemia and scar with a LVEF 43%. Hence she was admitted to the hospital, with a diagnosis of unstable angina pectoris, she was ruled out for myocardial infarction and underwent coronary angiography the following morning. Coronary arteriogram was normal. Hence felt discharge was appropriate, with evaluation of etiologies for noncardiac chest pain.    Neuro/Psych Anxiety TIAnegative neurological ROS     GI/Hepatic negative GI ROS, Neg liver ROS,   Endo/Other  negative endocrine ROSdiabetes, Type 2  Renal/GU negative Renal ROS     Musculoskeletal negative musculoskeletal ROS (+) Arthritis ,   Abdominal   Peds  Hematology negative hematology ROS (+) anemia ,   Anesthesia Other Findings   Reproductive/Obstetrics negative OB ROS                             Anesthesia Physical Anesthesia Plan  ASA: III  Anesthesia Plan: General and MAC   Post-op Pain Management:    Induction: Intravenous  Airway Management Planned:   Additional Equipment:   Intra-op Plan:   Post-operative Plan: Extubation in OR  Informed Consent: I have reviewed the patients History and Physical, chart, labs and discussed the procedure including the risks, benefits and alternatives for the proposed anesthesia with the patient or authorized representative who has indicated his/her understanding and acceptance.   Dental advisory given  Plan Discussed with: CRNA  Anesthesia Plan Comments:        Anesthesia Quick Evaluation

## 2015-07-12 NOTE — H&P (Signed)
There has been no change in the original history and physical since the time of dictation

## 2015-07-12 NOTE — Transfer of Care (Signed)
Immediate Anesthesia Transfer of Care Note  Patient: [name] [name]  Procedure(s) Performed: Procedure(s): DILATATION AND CURETTAGE /HYSTEROSCOPY (N/A)  Patient Location: PACU  Anesthesia Type:General  Level of Consciousness: awake, alert  and oriented  Airway & Oxygen Therapy: Patient Spontanous Breathing and Patient connected to nasal cannula oxygen  Post-op Assessment: Report given to RN and Post -op Vital signs reviewed and stable  Post vital signs: Reviewed and stable  Last Vitals:  Filed Vitals:   07/12/15 1043 07/12/15 1101  BP:  150/91  Pulse: 63   Temp: 36.8 C   Resp: 20     Complications: No apparent anesthesia complications

## 2015-07-13 ENCOUNTER — Encounter (HOSPITAL_COMMUNITY): Payer: Self-pay | Admitting: Obstetrics

## 2015-11-16 IMAGING — CR DG CHEST 2V
2 series · 2 of 2 positions shown · non-contrast
Comparison: 05/17/2012

CLINICAL DATA: Shortness of breath

EXAM:
CHEST  2 VIEW

[chest pa]
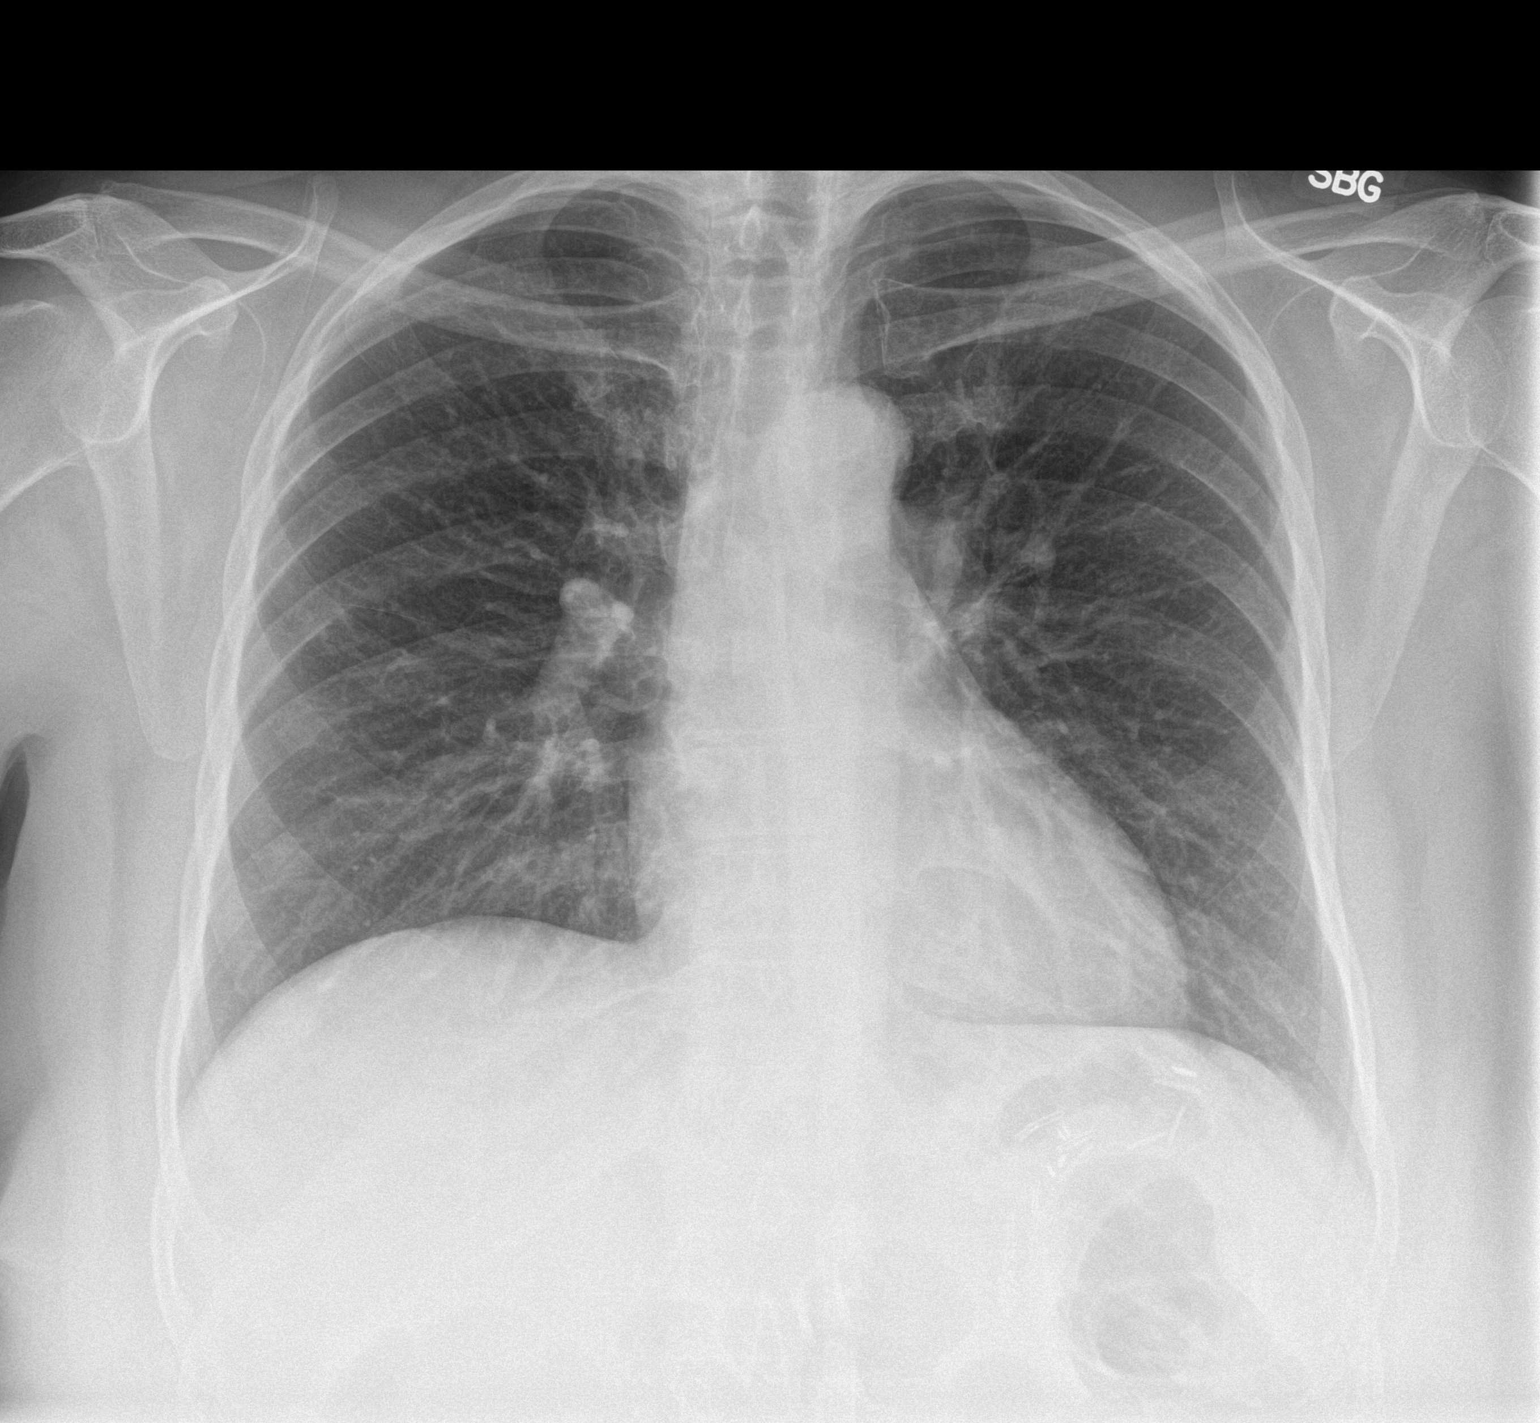

[chest lat]
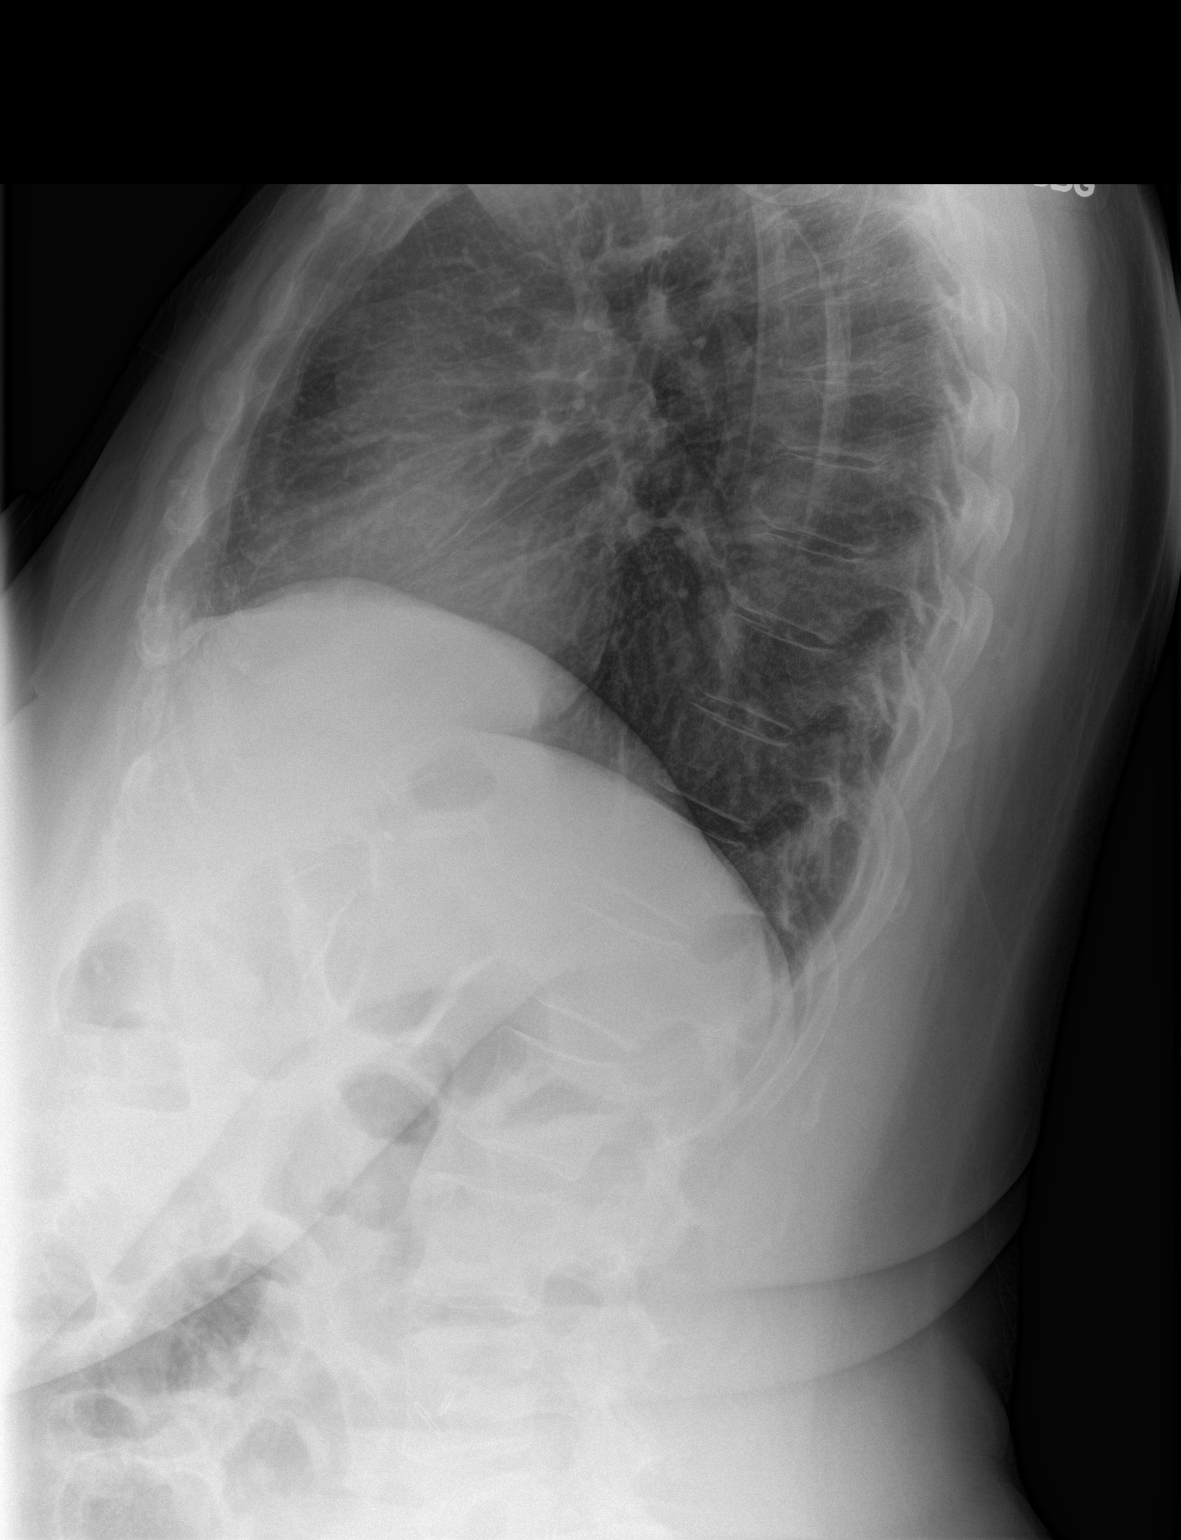

[2 of 2 positions shown; findings below may reference images not displayed]

FINDINGS: The heart size and mediastinal contours are within normal limits.
Both lungs are clear. The visualized skeletal structures are
unremarkable.
IMPRESSION: No active cardiopulmonary disease.

## 2015-12-26 ENCOUNTER — Encounter: Payer: Self-pay | Admitting: *Deleted

## 2016-02-12 ENCOUNTER — Encounter (HOSPITAL_COMMUNITY): Payer: Self-pay | Admitting: Emergency Medicine

## 2016-02-12 ENCOUNTER — Emergency Department (HOSPITAL_COMMUNITY)
Admission: EM | Admit: 2016-02-12 | Discharge: 2016-02-12 | Disposition: A | Discharge status: Home / Self Care | Payer: Medicaid Other | Attending: Emergency Medicine | Admitting: Emergency Medicine

## 2016-02-12 ENCOUNTER — Emergency Department (HOSPITAL_COMMUNITY): Payer: Medicaid Other

## 2016-02-12 DIAGNOSIS — E119 Type 2 diabetes mellitus without complications: Secondary | ICD-10-CM | POA: Diagnosis not present

## 2016-02-12 DIAGNOSIS — Y92410 Unspecified street and highway as the place of occurrence of the external cause: Secondary | ICD-10-CM | POA: Insufficient documentation

## 2016-02-12 DIAGNOSIS — Z87891 Personal history of nicotine dependence: Secondary | ICD-10-CM | POA: Insufficient documentation

## 2016-02-12 DIAGNOSIS — Y939 Activity, unspecified: Secondary | ICD-10-CM | POA: Diagnosis not present

## 2016-02-12 DIAGNOSIS — Y999 Unspecified external cause status: Secondary | ICD-10-CM | POA: Diagnosis not present

## 2016-02-12 DIAGNOSIS — Z7982 Long term (current) use of aspirin: Secondary | ICD-10-CM | POA: Insufficient documentation

## 2016-02-12 DIAGNOSIS — S161XXA Strain of muscle, fascia and tendon at neck level, initial encounter: Secondary | ICD-10-CM | POA: Insufficient documentation

## 2016-02-12 DIAGNOSIS — Z8673 Personal history of transient ischemic attack (TIA), and cerebral infarction without residual deficits: Secondary | ICD-10-CM | POA: Insufficient documentation

## 2016-02-12 DIAGNOSIS — I509 Heart failure, unspecified: Secondary | ICD-10-CM | POA: Insufficient documentation

## 2016-02-12 DIAGNOSIS — J45909 Unspecified asthma, uncomplicated: Secondary | ICD-10-CM | POA: Insufficient documentation

## 2016-02-12 DIAGNOSIS — S199XXA Unspecified injury of neck, initial encounter: Secondary | ICD-10-CM | POA: Diagnosis present

## 2016-02-12 MED ORDER — NAPROXEN 500 MG PO TABS: 500.0000 mg | ORAL_TABLET | Freq: Two times a day (BID) | ORAL | 0 refills | Status: DC | PRN

## 2016-02-12 MED ORDER — CYCLOBENZAPRINE HCL 5 MG PO TABS: 5.0000 mg | ORAL_TABLET | Freq: Three times a day (TID) | ORAL | 0 refills | Status: DC | PRN
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
## 2016-02-12 NOTE — Discharge Instructions (Signed)
Take naprosyn as directed for inflammation and pain with tylenol for breakthrough pain and flexeril for muscle relaxation. Do not drive or operate machinery with muscle relaxant use. Ice to areas of soreness for the next 24 hours and then may move to heat, no more than 20 minutes at a time every hour for each. Expect to be sore for the next few days and follow up with primary care physician for recheck of ongoing symptoms in the next 1-2 weeks. Return to ER for emergent changing or worsening of symptoms.  °  °

## 2016-05-02 ENCOUNTER — Encounter: Payer: Self-pay | Admitting: Gastroenterology

## 2016-05-06 ENCOUNTER — Encounter: Payer: Self-pay | Admitting: *Deleted

## 2016-05-09 ENCOUNTER — Ambulatory Visit: Payer: Medicaid Other | Admitting: Physician Assistant

## 2016-05-21 ENCOUNTER — Ambulatory Visit: Payer: Medicaid Other | Admitting: Physician Assistant

## 2016-05-23 ENCOUNTER — Ambulatory Visit (INDEPENDENT_AMBULATORY_CARE_PROVIDER_SITE_OTHER): Payer: Medicaid Other | Admitting: Physician Assistant

## 2016-05-23 ENCOUNTER — Encounter: Payer: Self-pay | Admitting: Physician Assistant

## 2016-05-23 VITALS — BP 132/82 | HR 68 | Ht 65.0 in | Wt 182.4 lb

## 2016-05-23 DIAGNOSIS — I10 Essential (primary) hypertension: Secondary | ICD-10-CM

## 2016-05-23 DIAGNOSIS — I5032 Chronic diastolic (congestive) heart failure: Secondary | ICD-10-CM | POA: Diagnosis not present

## 2016-05-23 DIAGNOSIS — Z7689 Persons encountering health services in other specified circumstances: Secondary | ICD-10-CM | POA: Diagnosis not present

## 2016-05-23 MED ORDER — NITROGLYCERIN 0.4 MG SL SUBL: 0.4000 mg | SUBLINGUAL_TABLET | SUBLINGUAL | 3 refills | Status: AC | PRN
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
## 2016-05-23 NOTE — Patient Instructions (Addendum)
  Medication Instructions:  None ordered  Labwork: None ordered  Testing/Procedures: None ordered  Follow-Up: Your physician wants you to follow-up in: 1 YEAR WITH DR. Johnell ComingsNELSON   You will receive a reminder letter in the mail two months in advance. If you don't receive a letter, please call our office to schedule the follow-up appointment.   Any Other Special Instructions Will Be Listed Below (If Applicable).       If you need a refill on your cardiac medications before your next appointment, please call your pharmacy.

## 2016-06-06 ENCOUNTER — Ambulatory Visit: Payer: Medicaid Other | Admitting: Obstetrics & Gynecology

## 2016-06-17 ENCOUNTER — Encounter: Payer: Self-pay | Admitting: Specialist

## 2016-06-25 ENCOUNTER — Ambulatory Visit: Payer: Medicaid Other | Attending: Sports Medicine | Admitting: Physical Therapy

## 2016-06-27 ENCOUNTER — Encounter: Payer: Medicaid Other | Admitting: Gastroenterology

## 2016-07-02 ENCOUNTER — Ambulatory Visit: Payer: Medicaid Other | Attending: Sports Medicine | Admitting: Physical Therapy

## 2016-07-02 DIAGNOSIS — M542 Cervicalgia: Secondary | ICD-10-CM | POA: Diagnosis not present

## 2016-07-02 DIAGNOSIS — M62838 Other muscle spasm: Secondary | ICD-10-CM | POA: Diagnosis present

## 2016-07-02 DIAGNOSIS — G8929 Other chronic pain: Secondary | ICD-10-CM | POA: Insufficient documentation

## 2016-07-02 DIAGNOSIS — M6281 Muscle weakness (generalized): Secondary | ICD-10-CM | POA: Insufficient documentation

## 2016-07-02 DIAGNOSIS — M545 Low back pain: Secondary | ICD-10-CM | POA: Diagnosis present
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
## 2016-07-11 ENCOUNTER — Ambulatory Visit (AMBULATORY_SURGERY_CENTER): Payer: Self-pay

## 2016-07-11 VITALS — Ht 65.0 in | Wt 181.0 lb

## 2016-07-11 DIAGNOSIS — Z1211 Encounter for screening for malignant neoplasm of colon: Secondary | ICD-10-CM

## 2016-07-11 MED ORDER — NA SULFATE-K SULFATE-MG SULF 17.5-3.13-1.6 GM/177ML PO SOLN: 1.0000 | Freq: Once | ORAL | 0 refills | Status: AC

## 2016-07-11 NOTE — Progress Notes (Signed)
Denies allergies to eggs or soy products. Denies complication of anesthesia or sedation. Denies use of weight loss medication. Denies use of O2.   Emmi instructions given for colonoscopy.  

## 2016-07-23 ENCOUNTER — Other Ambulatory Visit: Payer: Self-pay | Admitting: Specialist

## 2016-07-23 DIAGNOSIS — Z1231 Encounter for screening mammogram for malignant neoplasm of breast: Secondary | ICD-10-CM

## 2016-07-26 ENCOUNTER — Encounter: Payer: Self-pay | Admitting: Gastroenterology

## 2016-07-26 ENCOUNTER — Ambulatory Visit (AMBULATORY_SURGERY_CENTER): Payer: Medicaid Other | Admitting: Gastroenterology

## 2016-07-26 VITALS — BP 154/89 | HR 59 | Temp 96.8°F | Resp 10 | Ht 65.0 in | Wt 182.0 lb

## 2016-07-26 DIAGNOSIS — Z1211 Encounter for screening for malignant neoplasm of colon: Secondary | ICD-10-CM | POA: Diagnosis not present

## 2016-07-26 DIAGNOSIS — Z538 Procedure and treatment not carried out for other reasons: Secondary | ICD-10-CM | POA: Diagnosis not present

## 2016-07-26 DIAGNOSIS — Z1212 Encounter for screening for malignant neoplasm of rectum: Secondary | ICD-10-CM | POA: Diagnosis not present

## 2016-07-26 MED ORDER — SODIUM CHLORIDE 0.9 % IV SOLN: 500.0000 mL | INTRAVENOUS | Status: DC

## 2016-07-26 NOTE — Patient Instructions (Addendum)
YOU HAD AN ENDOSCOPIC PROCEDURE TODAY AT THE [facility]:   Refer to the procedure report that was given to you for any specific questions about what was found during the examination.  If the procedure report does not answer your questions, please call your gastroenterologist to clarify.  If you requested that your care partner not be given the details of your procedure findings, then the procedure report has been included in a sealed envelope for you to review at your convenience later.  YOU SHOULD EXPECT: Some feelings of bloating in the abdomen. Passage of more gas than usual.  Walking can help get rid of the air that was put into your GI tract during the procedure and reduce the bloating. If you had a lower endoscopy (such as a colonoscopy or flexible sigmoidoscopy) you may notice spotting of blood in your stool or on the toilet paper. If you underwent a bowel prep for your procedure, you may not have a normal bowel movement for a few days.  Please Note:  You might notice some irritation and congestion in your nose or some drainage.  This is from the oxygen used during your procedure.  There is no need for concern and it should clear up in a day or so.  SYMPTOMS TO REPORT IMMEDIATELY:   Following lower endoscopy (colonoscopy or flexible sigmoidoscopy):  Excessive amounts of blood in the stool  Significant tenderness or worsening of abdominal pains  Swelling of the abdomen that is new, acute  Fever of 100F or higher  For urgent or emergent issues, a gastroenterologist can be reached at any hour by calling [phone].   DIET:  We do recommend a small meal at first, but then you may proceed to your regular diet.  Drink plenty of fluids but you should avoid alcoholic beverages for 24 hours.  MEDICATIONS:  Continue present medications.  ACTIVITY:  You should plan to take it easy for the rest of today and you should NOT DRIVE or use heavy machinery until tomorrow (because of the  sedation medicines used during the test).    FOLLOW UP: Our staff will call the number listed on your records the next business day following your procedure to check on you and address any questions or concerns that you may have regarding the information given to you following your procedure. If we do not reach you, we will leave a message.  However, if you are feeling well and you are not experiencing any problems, there is no need to return our call.  We will assume that you have returned to your regular daily activities without incident.  If any biopsies were taken you will be contacted by phone or by letter within the next 1-3 weeks.  Please call us at [phone] if you have not heard about the biopsies in 3 weeks.   Repeat colonoscopy at next available appointment because the bowel preparation was sub-optimal. For future colonoscopy the patient will require an extended preparation. If there are any questions, please contact the gastroenterologist.  Thank you for allowing [place] to provide for your healthcare needs today.  SIGNATURES/CONFIDENTIALITY: You and/or your care partner have signed paperwork which will be entered into your electronic medical record.  These signatures attest to the fact that that the information above on your After Visit Summary has been reviewed and is understood.  Full responsibility of the confidentiality of this discharge information lies with you and/or your care-partner.

## 2016-07-26 NOTE — Op Note (Addendum)
[facility] Patient Name: [name] [name] Procedure Date: 07/26/2016 10:19 AM MRN: [id number] Endoscopist: Napoleon Form , MD Age: 56 Referring MD:  Date of Birth: 07-Oct-1960 Gender: Female Account #: [id number] Procedure:                Colonoscopy Indications:              Screening for colorectal malignant neoplasm Medicines:                Monitored Anesthesia Care Procedure:                Pre-Anesthesia Assessment:                           - Prior to the procedure, a History and Physical                            was performed, and patient medications and                            allergies were reviewed. The patient's tolerance of                            previous anesthesia was also reviewed. The risks                            and benefits of the procedure and the sedation                            options and risks were discussed with the patient.                            All questions were answered, and informed consent                            was obtained. Prior Anticoagulants: The patient has                            taken no previous anticoagulant or antiplatelet                            agents. ASA Grade Assessment: III - A patient with                            severe systemic disease. After reviewing the risks                            and benefits, the patient was deemed in                            satisfactory condition to undergo the procedure.                           After obtaining informed consent, the colonoscope  was passed under direct vision. Throughout the                            procedure, the patient's blood pressure, pulse, and                            oxygen saturations were monitored continuously. The                            Colonoscope was introduced through the anus and                            advanced to the the descending colon for                            evaluation.  This was the intended extent. The                            colonoscopy was technically difficult and complex                            due to poor bowel prep. The patient tolerated the                            procedure well. The quality of the bowel                            preparation was poor. The rectum was photographed. Scope In: 10:33:02 AM Scope Out: 10:35:20 AM Total Procedure Duration: 0 hours 2 minutes 18 seconds  Findings:                 The perianal and digital rectal examinations were                            normal.                           A large amount of semi-liquid semi-solid stool was                            found in the rectum, in the sigmoid colon and in                            the descending colon, precluding visualization. Complications:            No immediate complications. Estimated Blood Loss:     Estimated blood loss: none. Impression:               - Stool in the rectum, in the sigmoid colon and in                            the descending colon.                           - No specimens collected. Recommendation:           -  Patient has a contact number available for                            emergencies. The signs and symptoms of potential                            delayed complications were discussed with the                            patient. Return to normal activities tomorrow.                            Written discharge instructions were provided to the                            patient.                           - Resume previous diet.                           - Continue present medications.                           - Repeat colonoscopy at the next available                            appointment because the bowel preparation was                            suboptimal.                           - For future colonoscopy the patient will require                            an extended preparation (2 days prep with Miralax                             and Gatorade). If there are any questions, please                            contact the gastroenterologist. Napoleon Form, MD 07/26/2016 10:41:38 AM This report has been signed electronically.

## 2016-07-26 NOTE — Progress Notes (Signed)
A and O x3. Report to RN. Tolerated MAC anesthesia well.

## 2016-07-29 ENCOUNTER — Telehealth: Payer: Self-pay

## 2016-07-29 ENCOUNTER — Telehealth: Payer: Self-pay | Admitting: *Deleted

## 2016-07-29 NOTE — Telephone Encounter (Signed)
  Follow up Call-  Call back number 07/26/2016  Post procedure Call Back phone  # [phone]  Permission to leave phone message Yes  Some recent data might be hidden     Patient questions:  Do you have a fever, pain , or abdominal swelling? No. Pain Score  0 *  Have you tolerated food without any problems? Yes.    Have you been able to return to your normal activities? Yes.    Do you have any questions about your discharge instructions: Diet   No. Medications  No. Follow up visit  No.  Do you have questions or concerns about your Care? No.  Actions: * If pain score is 4 or above: No action needed, pain <4.

## 2016-07-29 NOTE — Telephone Encounter (Signed)
Attempted to f/u with patient post procedure on Friday. No answer. Left message that we will attempt to reach her again later today and to call us if she has any questions or concerns.

## 2016-08-09 ENCOUNTER — Encounter: Payer: Medicaid Other | Admitting: Gastroenterology

## 2016-08-12 ENCOUNTER — Telehealth: Payer: Self-pay | Admitting: Physician Assistant

## 2016-08-12 NOTE — Telephone Encounter (Signed)
Pt states for the last week she has had LE edema, usually okay in the morning but seems to get worse when she is up and about during the day. Pt states she worked more than usual and was on her feet a lot. Pt states her hands seem to be swollen in the morning but get better as the day goes on. Pt denies any chest pain, shortness of breath is unchanged, denies shortness of breath at night, pt does not weigh daily. Pt states last Friday when walking she had a fast heart rate, denies any dizziness.  Pt has been scheduled to see Vin 08/14/16. Pt has been advised to weigh daily, limit salt, keep feet and legs elevated as much as possible, monitor symptoms, call if any changes.

## 2016-08-12 NOTE — Telephone Encounter (Signed)
New Message  Pt c/o swelling: STAT is pt has developed SOB within 24 hours  1. How long have you been experiencing swelling? 7 days  2. Where is the swelling located? Feet, Hands, Ankles  3.  Are you currently taking a "fluid pill"? No  4.  Are you currently SOB? Yes  5.  Have you traveled recently? No

## 2016-08-13 ENCOUNTER — Encounter: Payer: Self-pay | Admitting: Physician Assistant
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
## 2016-08-14 ENCOUNTER — Encounter: Payer: Self-pay | Admitting: Physician Assistant

## 2016-08-14 ENCOUNTER — Ambulatory Visit (INDEPENDENT_AMBULATORY_CARE_PROVIDER_SITE_OTHER): Payer: Medicaid Other | Admitting: Physician Assistant

## 2016-08-14 VITALS — BP 136/92 | HR 78 | Ht 65.0 in | Wt 190.0 lb

## 2016-08-14 DIAGNOSIS — R600 Localized edema: Secondary | ICD-10-CM

## 2016-08-14 DIAGNOSIS — I10 Essential (primary) hypertension: Secondary | ICD-10-CM

## 2016-08-14 DIAGNOSIS — M1990 Unspecified osteoarthritis, unspecified site: Secondary | ICD-10-CM | POA: Diagnosis not present

## 2016-08-14 DIAGNOSIS — I5033 Acute on chronic diastolic (congestive) heart failure: Secondary | ICD-10-CM | POA: Diagnosis not present

## 2016-08-14 DIAGNOSIS — M792 Neuralgia and neuritis, unspecified: Secondary | ICD-10-CM | POA: Diagnosis not present

## 2016-08-14 MED ORDER — PANTOPRAZOLE SODIUM 40 MG PO TBEC: 40.0000 mg | DELAYED_RELEASE_TABLET | Freq: Every day | ORAL | 11 refills | Status: DC

## 2016-08-14 MED ORDER — FUROSEMIDE 20 MG PO TABS: ORAL_TABLET | ORAL | 3 refills | Status: DC

## 2016-08-14 MED ORDER — CARVEDILOL 3.125 MG PO TABS: 3.1250 mg | ORAL_TABLET | Freq: Two times a day (BID) | ORAL | 3 refills | Status: DC

## 2016-08-14 NOTE — Patient Instructions (Addendum)
Medication Instructions:  1.  START Protonix 40 mg taking 1 daily 2.  INCREASE the Lasix to 20 mg taking 2 tablets in the a.m for 3 days then go back to 1 tablet daily with the o.k to take 1 extra tablet ONLY AS NEEDED FOR SWELLING/EDEMA   Labwork: TODAY:  BMET & PRO BNP  Testing/Procedures: None ordered  Follow-Up: Your physician wants you to follow-up in: 9 MONTHS WITH DR. Johnell ComingsNELSON   You will receive a reminder letter in the mail two months in advance. If you don't receive a letter, please call our office to schedule the follow-up appointment.   Any Other Special Instructions Will Be Listed Below (If Applicable).     If you need a refill on your cardiac medications before your next appointment, please call your pharmacy.

## 2016-08-15 ENCOUNTER — Telehealth: Payer: Self-pay | Admitting: Physician Assistant

## 2016-08-15 LAB — BASIC METABOLIC PANEL
BUN/Creatinine Ratio: 11 (ref 9–23)
BUN: 9 mg/dL (ref 6–24)
CO2: 22 mmol/L (ref 18–29)
Calcium: 9 mg/dL (ref 8.7–10.2)
Chloride: 107 mmol/L — ABNORMAL HIGH (ref 96–106)
Creatinine, Ser: 0.81 mg/dL (ref 0.57–1.00)
GFR calc Af Amer: 93 mL/min/{1.73_m2} (ref >59)
GFR calc non Af Amer: 81 mL/min/{1.73_m2} (ref >59)
Glucose: 93 mg/dL (ref 65–99)
Potassium: 3.9 mmol/L (ref 3.5–5.2)
Sodium: 143 mmol/L (ref 134–144)

## 2016-08-15 LAB — PRO B NATRIURETIC PEPTIDE: NT-Pro BNP: 323 pg/mL — ABNORMAL HIGH (ref 0–287)

## 2016-08-15 NOTE — Telephone Encounter (Signed)
Patient returning call for results, thanks. °

## 2016-08-15 NOTE — Telephone Encounter (Signed)
-----   Message from JoesBhavinkumar Bhagat, [place] sent at 08/15/2016  8:10 AM EDT ----- Fluid marker minimally elevated. Trial of increase lasix as discussed during office visit.

## 2016-08-16 ENCOUNTER — Ambulatory Visit: Payer: Medicaid Other

## 2016-08-20 ENCOUNTER — Encounter: Payer: Medicaid Other | Admitting: Gastroenterology

## 2016-09-16 ENCOUNTER — Encounter: Payer: Self-pay | Admitting: Gastroenterology

## 2016-10-24 ENCOUNTER — Ambulatory Visit (AMBULATORY_SURGERY_CENTER): Payer: Self-pay

## 2016-10-24 VITALS — Ht 65.0 in | Wt 204.8 lb

## 2016-10-24 DIAGNOSIS — Z1211 Encounter for screening for malignant neoplasm of colon: Secondary | ICD-10-CM

## 2016-10-24 MED ORDER — NA SULFATE-K SULFATE-MG SULF 17.5-3.13-1.6 GM/177ML PO SOLN: 1.0000 | Freq: Once | ORAL | 0 refills | Status: AC

## 2016-10-24 NOTE — Progress Notes (Signed)
Denies allergies to eggs or soy products. Denies complication of anesthesia or sedation. Denies use of weight loss medication. Denies use of O2.   Emmi instructions given for colonoscopy.  

## 2016-11-05 ENCOUNTER — Telehealth: Payer: Self-pay | Admitting: Gastroenterology

## 2016-11-05 DIAGNOSIS — Z1211 Encounter for screening for malignant neoplasm of colon: Secondary | ICD-10-CM

## 2016-11-05 MED ORDER — BISACODYL 5 MG PO TBEC: 5.0000 mg | DELAYED_RELEASE_TABLET | Freq: Once | ORAL | 0 refills | Status: AC

## 2016-11-05 MED ORDER — BISACODYL 5 MG PO TBEC: 5.0000 mg | DELAYED_RELEASE_TABLET | Freq: Once | ORAL | 0 refills | Status: DC

## 2016-11-05 MED ORDER — POLYETHYLENE GLYCOL 3350 17 GM/SCOOP PO POWD: 1.0000 | Freq: Once | ORAL | 3 refills | Status: DC

## 2016-11-05 NOTE — Telephone Encounter (Signed)
Pt notified dulcolax and miralax sent as an order to her pharmacy. Mare

## 2016-11-05 NOTE — Addendum Note (Signed)
Addended by: Gillermina HuMCCRAW, Raenette Sakata W on: 11/05/2016 02:32 PM   Modules accepted: Orders

## 2016-11-05 NOTE — Telephone Encounter (Signed)
Patient calling back  States she needs medications sent to [facility] on [address] [place] Fax# [phone].

## 2016-11-05 NOTE — Telephone Encounter (Signed)
Scripts for dulcolax and miralax resent to [facility] as per request of pt. [name] [name] PV

## 2016-11-07 ENCOUNTER — Other Ambulatory Visit: Payer: Self-pay | Admitting: Physical Medicine and Rehabilitation

## 2016-11-07 DIAGNOSIS — M47816 Spondylosis without myelopathy or radiculopathy, lumbar region: Secondary | ICD-10-CM

## 2016-11-08 ENCOUNTER — Encounter: Payer: Medicaid Other | Admitting: Gastroenterology

## 2016-11-08 ENCOUNTER — Telehealth: Payer: Self-pay | Admitting: Gastroenterology

## 2016-11-08 NOTE — Telephone Encounter (Signed)
Ok, reschedule only if patient calls to do so. She has been non compliant in the past

## 2016-11-11 ENCOUNTER — Telehealth: Payer: Self-pay | Admitting: Gastroenterology

## 2016-11-11 MED ORDER — BISACODYL-PEG-KCL-NABICAR-NACL 5-210 MG-GM PO KIT: 2.0000 | PACK | Freq: Once | ORAL | 0 refills | Status: AC

## 2016-11-11 NOTE — Telephone Encounter (Signed)
Sent to pharmacy on [address] Pt called and made aware. Donnarae Rae/PV

## 2016-11-18 ENCOUNTER — Other Ambulatory Visit: Payer: Medicaid Other

## 2016-11-18 ENCOUNTER — Other Ambulatory Visit: Payer: Self-pay | Admitting: Physical Medicine and Rehabilitation

## 2016-11-18 DIAGNOSIS — M47816 Spondylosis without myelopathy or radiculopathy, lumbar region: Secondary | ICD-10-CM

## 2016-11-19 ENCOUNTER — Encounter: Payer: Medicaid Other | Admitting: Gastroenterology

## 2016-11-22 ENCOUNTER — Other Ambulatory Visit: Payer: Medicaid Other

## 2016-11-26 ENCOUNTER — Other Ambulatory Visit: Payer: Self-pay | Admitting: Physical Medicine and Rehabilitation

## 2016-11-26 ENCOUNTER — Ambulatory Visit
Admission: RE | Admit: 2016-11-26 | Discharge: 2016-11-26 | Disposition: A | Discharge status: Home / Self Care | Payer: Medicaid Other | Source: Ambulatory Visit | Attending: Physical Medicine and Rehabilitation | Admitting: Physical Medicine and Rehabilitation

## 2016-11-26 DIAGNOSIS — M47816 Spondylosis without myelopathy or radiculopathy, lumbar region: Secondary | ICD-10-CM

## 2016-11-26 NOTE — Discharge Instructions (Signed)

## 2016-12-19 ENCOUNTER — Emergency Department (HOSPITAL_COMMUNITY)
Admission: EM | Admit: 2016-12-19 | Discharge: 2016-12-19 | Disposition: A | Discharge status: Home / Self Care | Payer: Medicaid Other | Attending: Emergency Medicine | Admitting: Emergency Medicine

## 2016-12-19 ENCOUNTER — Encounter (HOSPITAL_COMMUNITY): Payer: Self-pay | Admitting: Emergency Medicine

## 2016-12-19 ENCOUNTER — Emergency Department (HOSPITAL_COMMUNITY): Payer: Medicaid Other

## 2016-12-19 DIAGNOSIS — Y9389 Activity, other specified: Secondary | ICD-10-CM | POA: Insufficient documentation

## 2016-12-19 DIAGNOSIS — S9032XA Contusion of left foot, initial encounter: Secondary | ICD-10-CM | POA: Diagnosis not present

## 2016-12-19 DIAGNOSIS — Z7982 Long term (current) use of aspirin: Secondary | ICD-10-CM | POA: Insufficient documentation

## 2016-12-19 DIAGNOSIS — Y998 Other external cause status: Secondary | ICD-10-CM | POA: Diagnosis not present

## 2016-12-19 DIAGNOSIS — F419 Anxiety disorder, unspecified: Secondary | ICD-10-CM | POA: Insufficient documentation

## 2016-12-19 DIAGNOSIS — F329 Major depressive disorder, single episode, unspecified: Secondary | ICD-10-CM | POA: Insufficient documentation

## 2016-12-19 DIAGNOSIS — Z87891 Personal history of nicotine dependence: Secondary | ICD-10-CM | POA: Insufficient documentation

## 2016-12-19 DIAGNOSIS — Z79899 Other long term (current) drug therapy: Secondary | ICD-10-CM | POA: Diagnosis not present

## 2016-12-19 DIAGNOSIS — J45909 Unspecified asthma, uncomplicated: Secondary | ICD-10-CM | POA: Diagnosis not present

## 2016-12-19 DIAGNOSIS — W010XXA Fall on same level from slipping, tripping and stumbling without subsequent striking against object, initial encounter: Secondary | ICD-10-CM | POA: Diagnosis not present

## 2016-12-19 DIAGNOSIS — Z9884 Bariatric surgery status: Secondary | ICD-10-CM | POA: Diagnosis not present

## 2016-12-19 DIAGNOSIS — I509 Heart failure, unspecified: Secondary | ICD-10-CM | POA: Diagnosis not present

## 2016-12-19 DIAGNOSIS — Y929 Unspecified place or not applicable: Secondary | ICD-10-CM | POA: Diagnosis not present

## 2016-12-19 DIAGNOSIS — Z8673 Personal history of transient ischemic attack (TIA), and cerebral infarction without residual deficits: Secondary | ICD-10-CM | POA: Diagnosis not present

## 2016-12-19 DIAGNOSIS — E119 Type 2 diabetes mellitus without complications: Secondary | ICD-10-CM | POA: Diagnosis not present

## 2016-12-19 DIAGNOSIS — M79675 Pain in left toe(s): Secondary | ICD-10-CM | POA: Diagnosis present

## 2016-12-19 NOTE — ED Notes (Signed)
Fell on left foot 4 days ago. Has had unrelenting pain and discoloration at base of toes/doral foot.

## 2016-12-19 NOTE — ED Notes (Signed)
Patient transported to X-ray 
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
## 2016-12-19 NOTE — Discharge Instructions (Signed)
It was my pleasure taking care of you today!   Ice and elevate for pain / swelling relief.   Follow up with your primary care provider if your symptoms do not improve in the next week.   Return to ER for new or worsening symptoms, any additional concerns.

## 2016-12-19 NOTE — ED Triage Notes (Signed)
Pt states she tripped leaving church Sunday and since has been having pain and swelling to left foot in second and third toe. Pt able to walk.

## 2016-12-31 ENCOUNTER — Other Ambulatory Visit: Payer: Self-pay | Admitting: Gastroenterology

## 2016-12-31 DIAGNOSIS — Z1211 Encounter for screening for malignant neoplasm of colon: Secondary | ICD-10-CM

## 2017-01-01 ENCOUNTER — Emergency Department (HOSPITAL_COMMUNITY): Payer: Self-pay

## 2017-01-01 ENCOUNTER — Telehealth: Payer: Self-pay | Admitting: Gastroenterology

## 2017-01-01 ENCOUNTER — Encounter (HOSPITAL_COMMUNITY): Payer: Self-pay | Admitting: *Deleted

## 2017-01-01 ENCOUNTER — Emergency Department (HOSPITAL_COMMUNITY)
Admission: EM | Admit: 2017-01-01 | Discharge: 2017-01-01 | Disposition: A | Discharge status: Home / Self Care | Payer: Self-pay | Attending: Emergency Medicine | Admitting: Emergency Medicine

## 2017-01-01 DIAGNOSIS — R61 Generalized hyperhidrosis: Secondary | ICD-10-CM | POA: Insufficient documentation

## 2017-01-01 DIAGNOSIS — Z8673 Personal history of transient ischemic attack (TIA), and cerebral infarction without residual deficits: Secondary | ICD-10-CM | POA: Insufficient documentation

## 2017-01-01 DIAGNOSIS — Z79899 Other long term (current) drug therapy: Secondary | ICD-10-CM | POA: Insufficient documentation

## 2017-01-01 DIAGNOSIS — I509 Heart failure, unspecified: Secondary | ICD-10-CM | POA: Insufficient documentation

## 2017-01-01 DIAGNOSIS — Z87891 Personal history of nicotine dependence: Secondary | ICD-10-CM | POA: Insufficient documentation

## 2017-01-01 DIAGNOSIS — E119 Type 2 diabetes mellitus without complications: Secondary | ICD-10-CM | POA: Insufficient documentation

## 2017-01-01 DIAGNOSIS — Z7982 Long term (current) use of aspirin: Secondary | ICD-10-CM | POA: Insufficient documentation

## 2017-01-01 DIAGNOSIS — E876 Hypokalemia: Secondary | ICD-10-CM | POA: Insufficient documentation

## 2017-01-01 DIAGNOSIS — J45909 Unspecified asthma, uncomplicated: Secondary | ICD-10-CM | POA: Insufficient documentation

## 2017-01-01 DIAGNOSIS — R42 Dizziness and giddiness: Secondary | ICD-10-CM | POA: Insufficient documentation

## 2017-01-01 DIAGNOSIS — I471 Supraventricular tachycardia: Secondary | ICD-10-CM | POA: Insufficient documentation

## 2017-01-01 LAB — I-STAT CHEM 8, ED
BUN: 6 mg/dL (ref 6–20)
Calcium, Ion: 1.14 mmol/L — ABNORMAL LOW (ref 1.15–1.40)
Chloride: 107 mmol/L (ref 101–111)
Creatinine, Ser: 0.7 mg/dL (ref 0.44–1.00)
Glucose, Bld: 215 mg/dL — ABNORMAL HIGH (ref 65–99)
HCT: 41 % (ref 36.0–46.0)
Hemoglobin: 13.9 g/dL (ref 12.0–15.0)
Potassium: 3.3 mmol/L — ABNORMAL LOW (ref 3.5–5.1)
Sodium: 142 mmol/L (ref 135–145)
TCO2: 23 mmol/L (ref 22–32)

## 2017-01-01 LAB — BASIC METABOLIC PANEL
Anion gap: 10 (ref 5–15)
BUN: 6 mg/dL (ref 6–20)
CO2: 22 mmol/L (ref 22–32)
Calcium: 9.6 mg/dL (ref 8.9–10.3)
Chloride: 108 mmol/L (ref 101–111)
Creatinine, Ser: 0.86 mg/dL (ref 0.44–1.00)
GFR calc Af Amer: >60 mL/min (ref >60)
GFR calc non Af Amer: >60 mL/min (ref >60)
Glucose, Bld: 211 mg/dL — ABNORMAL HIGH (ref 65–99)
Potassium: 3.2 mmol/L — ABNORMAL LOW (ref 3.5–5.1)
Sodium: 140 mmol/L (ref 135–145)

## 2017-01-01 LAB — CBC
HCT: 40.3 % (ref 36.0–46.0)
Hemoglobin: 13.1 g/dL (ref 12.0–15.0)
MCH: 26.1 pg (ref 26.0–34.0)
MCHC: 32.5 g/dL (ref 30.0–36.0)
MCV: 80.3 fL (ref 78.0–100.0)
Platelets: 337 10*3/uL (ref 150–400)
RBC: 5.02 MIL/uL (ref 3.87–5.11)
RDW: 15.5 % (ref 11.5–15.5)
WBC: 7.4 10*3/uL (ref 4.0–10.5)

## 2017-01-01 LAB — I-STAT TROPONIN, ED: Troponin i, poc: 0.01 ng/mL (ref 0.00–0.08)
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
## 2017-01-01 NOTE — Discharge Instructions (Signed)
Please follow with your primary care doctor in the next 2 days for a check-up. They must obtain records for further management.  ° °Do not hesitate to return to the Emergency Department for any new, worsening or concerning symptoms.  ° °

## 2017-01-01 NOTE — Telephone Encounter (Signed)
Ok

## 2017-01-01 NOTE — ED Triage Notes (Signed)
To ED for eval of feeling her heart racing. Started after pt woke this am. States she had a difficult time sleeping last night. Pt is very anxious. States this has happened before and she is under care cardiology

## 2017-01-02 ENCOUNTER — Encounter: Payer: Medicaid Other | Admitting: Gastroenterology

## 2017-03-04 ENCOUNTER — Encounter: Payer: Medicaid Other | Admitting: Gastroenterology

## 2017-04-19 ENCOUNTER — Emergency Department (HOSPITAL_COMMUNITY): Payer: Self-pay

## 2017-04-19 ENCOUNTER — Emergency Department (HOSPITAL_COMMUNITY)
Admission: EM | Admit: 2017-04-19 | Discharge: 2017-04-19 | Disposition: A | Discharge status: Home / Self Care | Payer: Self-pay | Attending: Emergency Medicine | Admitting: Emergency Medicine

## 2017-04-19 DIAGNOSIS — R0602 Shortness of breath: Secondary | ICD-10-CM | POA: Insufficient documentation

## 2017-04-19 DIAGNOSIS — Z5321 Procedure and treatment not carried out due to patient leaving prior to being seen by health care provider: Secondary | ICD-10-CM | POA: Insufficient documentation

## 2017-04-19 LAB — BASIC METABOLIC PANEL
Anion gap: 13 (ref 5–15)
BUN: 9 mg/dL (ref 6–20)
CO2: 23 mmol/L (ref 22–32)
Calcium: 9.1 mg/dL (ref 8.9–10.3)
Chloride: 106 mmol/L (ref 101–111)
Creatinine, Ser: 0.72 mg/dL (ref 0.44–1.00)
GFR calc Af Amer: >60 mL/min (ref >60)
GFR calc non Af Amer: >60 mL/min (ref >60)
Glucose, Bld: 102 mg/dL — ABNORMAL HIGH (ref 65–99)
Potassium: 3.6 mmol/L (ref 3.5–5.1)
Sodium: 142 mmol/L (ref 135–145)

## 2017-04-19 LAB — CBC
HCT: 37.1 % (ref 36.0–46.0)
Hemoglobin: 12.3 g/dL (ref 12.0–15.0)
MCH: 27 pg (ref 26.0–34.0)
MCHC: 33.2 g/dL (ref 30.0–36.0)
MCV: 81.4 fL (ref 78.0–100.0)
Platelets: 211 10*3/uL (ref 150–400)
RBC: 4.56 MIL/uL (ref 3.87–5.11)
RDW: 14.9 % (ref 11.5–15.5)
WBC: 5.9 10*3/uL (ref 4.0–10.5)

## 2017-04-19 LAB — I-STAT TROPONIN, ED: Troponin i, poc: 0 ng/mL (ref 0.00–0.08)

## 2017-04-19 NOTE — ED Triage Notes (Addendum)
Pt reports she began having shortness of breath onset yesterday with nasal congestion and productive cough with green phlegm.  Pt reports feeling chest tightness  Pt states she has been off all medication d/t no finances x 2 months.

## 2017-07-03 ENCOUNTER — Emergency Department (HOSPITAL_COMMUNITY)
Admission: EM | Admit: 2017-07-03 | Discharge: 2017-07-03 | Disposition: A | Discharge status: Home / Self Care | Payer: Self-pay | Attending: Emergency Medicine | Admitting: Emergency Medicine

## 2017-07-03 ENCOUNTER — Emergency Department (HOSPITAL_COMMUNITY): Payer: Self-pay

## 2017-07-03 ENCOUNTER — Encounter (HOSPITAL_COMMUNITY): Payer: Self-pay | Admitting: *Deleted

## 2017-07-03 ENCOUNTER — Other Ambulatory Visit: Payer: Self-pay

## 2017-07-03 DIAGNOSIS — M5126 Other intervertebral disc displacement, lumbar region: Secondary | ICD-10-CM | POA: Insufficient documentation

## 2017-07-03 DIAGNOSIS — Z7982 Long term (current) use of aspirin: Secondary | ICD-10-CM | POA: Insufficient documentation

## 2017-07-03 DIAGNOSIS — Z79899 Other long term (current) drug therapy: Secondary | ICD-10-CM | POA: Insufficient documentation

## 2017-07-03 DIAGNOSIS — I509 Heart failure, unspecified: Secondary | ICD-10-CM | POA: Insufficient documentation

## 2017-07-03 DIAGNOSIS — R42 Dizziness and giddiness: Secondary | ICD-10-CM | POA: Insufficient documentation

## 2017-07-03 DIAGNOSIS — E119 Type 2 diabetes mellitus without complications: Secondary | ICD-10-CM | POA: Insufficient documentation

## 2017-07-03 DIAGNOSIS — Z87891 Personal history of nicotine dependence: Secondary | ICD-10-CM | POA: Insufficient documentation

## 2017-07-03 LAB — URINALYSIS, ROUTINE W REFLEX MICROSCOPIC
Bilirubin Urine: NEGATIVE
Glucose, UA: NEGATIVE mg/dL
Hgb urine dipstick: NEGATIVE
Ketones, ur: NEGATIVE mg/dL
Leukocytes, UA: NEGATIVE
Nitrite: NEGATIVE
Protein, ur: NEGATIVE mg/dL
Specific Gravity, Urine: 1.011 (ref 1.005–1.030)
pH: 5 (ref 5.0–8.0)

## 2017-07-03 LAB — BASIC METABOLIC PANEL
Anion gap: 13 (ref 5–15)
BUN: 8 mg/dL (ref 6–20)
CO2: 23 mmol/L (ref 22–32)
Calcium: 9.3 mg/dL (ref 8.9–10.3)
Chloride: 103 mmol/L (ref 101–111)
Creatinine, Ser: 0.88 mg/dL (ref 0.44–1.00)
GFR calc Af Amer: >60 mL/min (ref >60)
GFR calc non Af Amer: >60 mL/min (ref >60)
Glucose, Bld: 105 mg/dL — ABNORMAL HIGH (ref 65–99)
Potassium: 3.7 mmol/L (ref 3.5–5.1)
Sodium: 139 mmol/L (ref 135–145)

## 2017-07-03 LAB — CBG MONITORING, ED: Glucose-Capillary: 99 mg/dL (ref 65–99)

## 2017-07-03 LAB — CBC
HCT: 41.2 % (ref 36.0–46.0)
Hemoglobin: 13.3 g/dL (ref 12.0–15.0)
MCH: 26 pg (ref 26.0–34.0)
MCHC: 32.3 g/dL (ref 30.0–36.0)
MCV: 80.5 fL (ref 78.0–100.0)
Platelets: 261 10*3/uL (ref 150–400)
RBC: 5.12 MIL/uL — ABNORMAL HIGH (ref 3.87–5.11)
RDW: 15.3 % (ref 11.5–15.5)
WBC: 6.8 10*3/uL (ref 4.0–10.5)

## 2017-07-03 MED ORDER — SODIUM CHLORIDE 0.9 % IV BOLUS
1000.0000 mL | Freq: Once | INTRAVENOUS | Status: AC
  Administered 2017-07-03: 1000 mL via INTRAVENOUS

## 2017-07-03 MED ORDER — LORAZEPAM 2 MG/ML IJ SOLN
1.0000 mg | Freq: Once | INTRAMUSCULAR | Status: AC
  Administered 2017-07-03: 1 mg via INTRAVENOUS
  Filled 2017-07-03: qty 1

## 2017-07-03 MED ORDER — NAPROXEN 500 MG PO TABS: 500.0000 mg | ORAL_TABLET | Freq: Two times a day (BID) | ORAL | 0 refills | Status: DC

## 2017-07-03 NOTE — ED Notes (Signed)
Pt given urine specimen cup.

## 2017-07-03 NOTE — ED Notes (Signed)
ED Provider at bedside. 

## 2017-07-03 NOTE — ED Triage Notes (Signed)
Pt in c/o lower back pain that radiates down her legs, also states she got dizzy this morning at work, no distress noted, denies chest pain

## 2017-07-03 NOTE — Discharge Instructions (Addendum)
The MRI in the ER shows that you have slightly herniated disc. There is no evidence of spinal cord compression that needs emergent evaluation. Continue with the exercises that have been recommended and see your spine surgeon as soon as possible.

## 2017-07-03 NOTE — ED Notes (Signed)
Pt upset that she is being seen by a PA. PA bedside at this time
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
## 2018-02-05 ENCOUNTER — Telehealth: Payer: Self-pay

## 2018-02-05 NOTE — Telephone Encounter (Signed)
SENT REFERRAL TO SCHEDULING AND FILED NOTES 

## 2018-02-17 ENCOUNTER — Ambulatory Visit (INDEPENDENT_AMBULATORY_CARE_PROVIDER_SITE_OTHER): Payer: Self-pay | Admitting: Cardiology

## 2018-02-17 ENCOUNTER — Encounter: Payer: Self-pay | Admitting: Cardiology

## 2018-02-17 VITALS — BP 104/70 | HR 61 | Ht 65.0 in | Wt 178.0 lb

## 2018-02-17 DIAGNOSIS — I10 Essential (primary) hypertension: Secondary | ICD-10-CM

## 2018-02-17 DIAGNOSIS — R0609 Other forms of dyspnea: Secondary | ICD-10-CM

## 2018-02-17 DIAGNOSIS — R0600 Dyspnea, unspecified: Secondary | ICD-10-CM

## 2018-02-17 DIAGNOSIS — I5032 Chronic diastolic (congestive) heart failure: Secondary | ICD-10-CM

## 2018-02-17 DIAGNOSIS — E785 Hyperlipidemia, unspecified: Secondary | ICD-10-CM

## 2018-02-17 MED ORDER — CARVEDILOL 3.125 MG PO TABS: 3.1250 mg | ORAL_TABLET | Freq: Two times a day (BID) | ORAL | 3 refills | Status: DC

## 2018-02-17 MED ORDER — FUROSEMIDE 40 MG PO TABS: 40.0000 mg | ORAL_TABLET | Freq: Every day | ORAL | 3 refills | Status: DC
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
## 2018-02-17 NOTE — Patient Instructions (Signed)
Medication Instructions:   Your physician recommends that you continue on your current medications as directed. Please refer to the Current Medication list given to you today.   If you need a refill on your cardiac medications before your next appointment, please call your pharmacy.     Testing/Procedures:  Your physician has requested that you have an echocardiogram. Echocardiography is a painless test that uses sound waves to create images of your heart. It provides your doctor with information about the size and shape of your heart and how well your heart's chambers and valves are working. This procedure takes approximately one hour. There are no restrictions for this procedure.     Follow-Up: At [facility], you and your health needs are [facility].  As part of our continuing mission to provide you with exceptional heart care, we have created designated Provider Care Teams.  These Care Teams include your primary Cardiologist (physician) and Advanced Practice Providers (APPs -  Physician Assistants and Nurse Practitioners) who all work together to provide you with the care you need, when you need it. You will need a follow up appointment in 4 months with Dr. Delton SeeNelson.  Please call our office 2 months in advance to schedule this appointment.  You may see Dr. Delton SeeNelson or one of the following Advanced Practice Providers on your designated Care Team:   PringleBrittainy Simmons, PA-C Ronie Spiesayna Dunn, PA-C . Jacolyn ReedyMichele Lenze, PA-C

## 2018-02-24 ENCOUNTER — Other Ambulatory Visit (HOSPITAL_COMMUNITY): Payer: Self-pay

## 2018-03-04 ENCOUNTER — Other Ambulatory Visit (HOSPITAL_COMMUNITY): Payer: Medicaid Other

## 2018-03-05 ENCOUNTER — Telehealth: Payer: Self-pay | Admitting: Cardiology

## 2018-03-05 ENCOUNTER — Other Ambulatory Visit: Payer: Self-pay

## 2018-03-05 ENCOUNTER — Ambulatory Visit (HOSPITAL_COMMUNITY): Payer: Medicaid Other | Attending: Cardiovascular Disease

## 2018-03-05 DIAGNOSIS — R0609 Other forms of dyspnea: Secondary | ICD-10-CM | POA: Diagnosis present

## 2018-03-05 DIAGNOSIS — I10 Essential (primary) hypertension: Secondary | ICD-10-CM | POA: Insufficient documentation

## 2018-03-05 DIAGNOSIS — I5032 Chronic diastolic (congestive) heart failure: Secondary | ICD-10-CM | POA: Diagnosis present

## 2018-03-05 DIAGNOSIS — R0600 Dyspnea, unspecified: Secondary | ICD-10-CM

## 2018-03-05 DIAGNOSIS — E785 Hyperlipidemia, unspecified: Secondary | ICD-10-CM | POA: Diagnosis present

## 2018-03-05 NOTE — Telephone Encounter (Signed)
Spoke with pt who wanted to note in her chart that she continues to be SOB even after her increase in lasix at her last OV. Pt states she needs to sleep with "lots of pillows" at night and when she lays flat she finds it difficult to breath. Pt also feels SOB when walking to and from her car to the grocery store. At the time of our phone call, pt did not audibly sound SOB.  I advised pt Dr Delton SeeNelson would probably be looking at her ECHO sometimes soon and will forward her message to Dr Delton SeeNelson and her nurse to further address.

## 2018-03-05 NOTE — Telephone Encounter (Signed)
Patient came in for appointment. Patient wants to send a message to Dr. Delton SeeNelson regarding having shortness of breath. Patient states she has to use lots of pillows at night due to not being able to breathe. Also when patient had echo this morning patient had a hard time breathing. Please call patient.

## 2018-03-09 NOTE — Telephone Encounter (Signed)
New Message:    Patient returning call from last week concerning ECHO results.

## 2018-03-09 NOTE — Telephone Encounter (Signed)
Notes recorded by Lars MassonNelson, Katarina H, MD on 03/05/2018 at 3:31 PM EST Normal LVEF, no significant valvular abnormalities.  Notified the pt of echo results per Dr Delton SeeNelson, as mentioned above. Pt verbalized understanding.

## 2018-04-06 ENCOUNTER — Ambulatory Visit: Payer: No Typology Code available for payment source | Admitting: Internal Medicine

## 2018-06-16 ENCOUNTER — Telehealth: Payer: Self-pay | Admitting: *Deleted

## 2018-06-16 NOTE — Telephone Encounter (Signed)
Appointment Cancelled due to Coronavirus:  Called patient in regards to 4 month routine f/u appointment with Dr. Delton See on 06/22/18.   Patient denies having any chest pain, SOB, cough, fever, or any other Sx.   Patient was okay with cancelling appointment and has been made aware that she will be contacted in the near future to reschedule.   Refills have been sent in if needed. -pt states she just refilled her 3 month supply of her cardiac meds yesterday.  Patient understands to let us know if she develops any Sx before then.

## 2018-06-22 ENCOUNTER — Ambulatory Visit: Payer: Self-pay | Admitting: Cardiology

## 2018-07-14 NOTE — Telephone Encounter (Signed)
Virtual Visit Pre-Appointment Phone Call  TELEPHONE CALL NOTE  [name] [name] has been deemed a candidate for a follow-up tele-health visit to limit community exposure during the Covid-19 pandemic. I spoke with the patient via phone to ensure availability of phone/video source, confirm preferred email & phone number, and discuss instructions and expectations.  I reminded [name] [name] to be prepared with any vital sign and/or heart rhythm information that could potentially be obtained via home monitoring, at the time of her visit. I reminded [name] [name] to expect a phone call at the time of her visit if her visit.   Patient agrees to consent below.  [name] Haw, RN 07/14/2018 4:42 PM   FULL LENGTH CONSENT FOR TELE-HEALTH VISIT   I hereby voluntarily request, consent and authorize [facility] and its employed or contracted physicians, physician assistants, nurse practitioners or other licensed health care professionals (the Practitioner), to provide me with telemedicine health care services (the "Services") as deemed necessary by the treating Practitioner. I acknowledge and consent to receive the Services by the Practitioner via telemedicine. I understand that the telemedicine visit will involve communicating with the Practitioner through live audiovisual communication technology and the disclosure of certain medical information by electronic transmission. I acknowledge that I have been given the opportunity to request an in-person assessment or other available alternative prior to the telemedicine visit and am voluntarily participating in the telemedicine visit.  I understand that I have the right to withhold or withdraw my consent to the use of telemedicine in the course of my care at any time, without affecting my right to future care or treatment, and that the Practitioner or I may terminate the telemedicine visit at any time. I understand that I have the right to  inspect all information obtained and/or recorded in the course of the telemedicine visit and may receive copies of available information for a reasonable fee.  I understand that some of the potential risks of receiving the Services via telemedicine include:  Marland Kitchen Delay or interruption in medical evaluation due to technological equipment failure or disruption; . Information transmitted may not be sufficient (e.g. poor resolution of images) to allow for appropriate medical decision making by the Practitioner; and/or  . In rare instances, security protocols could fail, causing a breach of personal health information.  Furthermore, I acknowledge that it is my responsibility to provide information about my medical history, conditions and care that is complete and accurate to the best of my ability. I acknowledge that Practitioner's advice, recommendations, and/or decision may be based on factors not within their control, such as incomplete or inaccurate data provided by me or distortions of diagnostic images or specimens that may result from electronic transmissions. I understand that the practice of medicine is not an exact science and that Practitioner makes no warranties or guarantees regarding treatment outcomes. I acknowledge that I will receive a copy of this consent concurrently upon execution via email to the email address I last provided but may also request a printed copy by calling the office of [facility].    I understand that my insurance will be billed for this visit.   I have read or had this consent read to me. . I understand the contents of this consent, which adequately explains the benefits and risks of the Services being provided via telemedicine.  . I have been provided ample opportunity to ask questions regarding this consent and the Services and have had my questions answered to my  satisfaction. . I give my informed consent for the services to be provided through the use of  telemedicine in my medical care  By participating in this telemedicine visit I agree to the above.
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
## 2018-07-22 ENCOUNTER — Other Ambulatory Visit: Payer: Self-pay

## 2018-07-22 ENCOUNTER — Telehealth (INDEPENDENT_AMBULATORY_CARE_PROVIDER_SITE_OTHER): Payer: Medicaid Other | Admitting: Physician Assistant

## 2018-07-22 ENCOUNTER — Encounter: Payer: Self-pay | Admitting: Physician Assistant

## 2018-07-22 VITALS — Ht 65.0 in | Wt 173.0 lb

## 2018-07-22 DIAGNOSIS — M47816 Spondylosis without myelopathy or radiculopathy, lumbar region: Secondary | ICD-10-CM | POA: Insufficient documentation

## 2018-07-22 DIAGNOSIS — E785 Hyperlipidemia, unspecified: Secondary | ICD-10-CM

## 2018-07-22 DIAGNOSIS — R0609 Other forms of dyspnea: Secondary | ICD-10-CM

## 2018-07-22 DIAGNOSIS — Z8679 Personal history of other diseases of the circulatory system: Secondary | ICD-10-CM

## 2018-07-22 DIAGNOSIS — I5032 Chronic diastolic (congestive) heart failure: Secondary | ICD-10-CM

## 2018-07-22 DIAGNOSIS — R0600 Dyspnea, unspecified: Secondary | ICD-10-CM

## 2018-07-22 DIAGNOSIS — I471 Supraventricular tachycardia: Secondary | ICD-10-CM

## 2018-07-22 DIAGNOSIS — Z87898 Personal history of other specified conditions: Secondary | ICD-10-CM

## 2018-07-22 DIAGNOSIS — Z8673 Personal history of transient ischemic attack (TIA), and cerebral infarction without residual deficits: Secondary | ICD-10-CM

## 2018-07-22 DIAGNOSIS — I10 Essential (primary) hypertension: Secondary | ICD-10-CM

## 2018-07-22 MED ORDER — FUROSEMIDE 40 MG PO TABS
40.0000 mg | ORAL_TABLET | Freq: Every day | ORAL | 3 refills | Status: DC
Start: 2018-07-22 — End: 2020-05-23

## 2018-07-22 MED ORDER — CARVEDILOL 3.125 MG PO TABS: 3.1250 mg | ORAL_TABLET | Freq: Two times a day (BID) | ORAL | 3 refills | Status: DC

## 2018-07-22 NOTE — Patient Instructions (Signed)
Medication Instructions:  Your physician recommends that you continue on your current medications as directed. Please refer to the Current Medication list given to you today.  If you need a refill on your cardiac medications before your next appointment, please call your pharmacy.   Lab work: None Ordered  If you have labs (blood work) drawn today and your tests are completely normal, you will receive your results only by: . MyChart Message (if you have MyChart) OR . A paper copy in the mail If you have any lab test that is abnormal or we need to change your treatment, we will call you to review the results.  Testing/Procedures: None ordered  Follow-Up: At [facility], you and your health needs are our priority.  As part of our continuing mission to provide you with exceptional heart care, we have created designated Provider Care Teams.  These Care Teams include your primary Cardiologist (physician) and Advanced Practice Providers (APPs -  Physician Assistants and Nurse Practitioners) who all work together to provide you with the care you need, when you need it. . You will need a follow up appointment in 1 year.  Please call our office 2 months in advance to schedule this appointment.  You may see Katarina Nelson, MD or one of the following Advanced Practice Providers on your designated Care Team:   . Brittainy Simmons, PA-C . Dayna Dunn, PA-C . Michele Lenze, PA-C  Any Other Special Instructions Will Be Listed Below (If Applicable).    

## 2018-08-20 ENCOUNTER — Telehealth: Payer: Self-pay | Admitting: Cardiology

## 2018-08-20 NOTE — Telephone Encounter (Signed)
Called and spoke to patient. She states that she has had increased swelling in her hands and feet. She denies SOB or any other Sx. She does not weigh herself daily. Patient takes furosemide 40 mg QD. No recent labs on file. Patient admits to eating an excessive amount of salt in her diet. Reviewed 2 gram sodium diet with the patient. Instructed to weigh herself daily at the same time with the same amount of clothes on and keep a record. Patient is to notify us of 3 lb weight gain in 24 hours or  5 lb weight gain in 1 week. Instructed patient to elevate legs and wear compression stockings to assist with swelling. Patient will notify us of weight gain listed above and if her Sx change or worsen.

## 2018-08-20 NOTE — Telephone Encounter (Signed)
Pt c/o swelling: STAT is pt has developed SOB within 24 hours  1) How much weight have you gained and in what time span? Not checking weight  2) If swelling, where is the swelling located? Hands and feet  3) Are you currently taking a fluid pill? furosemide  4) Are you currently SOB? no  5) Do you have a log of your daily weights (if so, list)? Not tracking weight  6) Have you gained 3 pounds in a day or 5 pounds in a week? Not tracking weight  7) Have you traveled recently? No  Pt states that she has been retaining water over the past two weeks. She wants to know if she could take an additional dose of furosemide to reduce the fluid

## 2018-09-03 ENCOUNTER — Telehealth: Payer: Self-pay | Admitting: Physician Assistant

## 2018-09-03 NOTE — Telephone Encounter (Signed)
New Message             Patient is calling today to ask a question about her medication "Furosemide and Carvedilo, patient is getting life insurance and the representative is asking some questions about the medication. Patient would like a call  Back as soon as possible.

## 2018-09-03 NOTE — Telephone Encounter (Signed)
Pt last seen by Jacolyn Reedy, PA

## 2018-10-05 DIAGNOSIS — F319 Bipolar disorder, unspecified: Secondary | ICD-10-CM | POA: Insufficient documentation

## 2018-10-05 DIAGNOSIS — F4310 Post-traumatic stress disorder, unspecified: Secondary | ICD-10-CM | POA: Insufficient documentation

## 2018-10-05 DIAGNOSIS — F322 Major depressive disorder, single episode, severe without psychotic features: Secondary | ICD-10-CM | POA: Insufficient documentation

## 2019-03-07 ENCOUNTER — Emergency Department (HOSPITAL_COMMUNITY)
Admission: EM | Admit: 2019-03-07 | Discharge: 2019-03-07 | Disposition: A | Discharge status: Home / Self Care | Payer: Medicaid Other | Attending: Emergency Medicine | Admitting: Emergency Medicine

## 2019-03-07 ENCOUNTER — Emergency Department (HOSPITAL_COMMUNITY): Payer: Medicaid Other

## 2019-03-07 ENCOUNTER — Encounter (HOSPITAL_COMMUNITY): Payer: Self-pay

## 2019-03-07 DIAGNOSIS — Y999 Unspecified external cause status: Secondary | ICD-10-CM | POA: Insufficient documentation

## 2019-03-07 DIAGNOSIS — Z87891 Personal history of nicotine dependence: Secondary | ICD-10-CM | POA: Insufficient documentation

## 2019-03-07 DIAGNOSIS — E119 Type 2 diabetes mellitus without complications: Secondary | ICD-10-CM | POA: Diagnosis not present

## 2019-03-07 DIAGNOSIS — Y92013 Bedroom of single-family (private) house as the place of occurrence of the external cause: Secondary | ICD-10-CM | POA: Insufficient documentation

## 2019-03-07 DIAGNOSIS — S99922A Unspecified injury of left foot, initial encounter: Secondary | ICD-10-CM | POA: Diagnosis present

## 2019-03-07 DIAGNOSIS — Z7982 Long term (current) use of aspirin: Secondary | ICD-10-CM | POA: Insufficient documentation

## 2019-03-07 DIAGNOSIS — J45909 Unspecified asthma, uncomplicated: Secondary | ICD-10-CM | POA: Insufficient documentation

## 2019-03-07 DIAGNOSIS — I509 Heart failure, unspecified: Secondary | ICD-10-CM | POA: Diagnosis not present

## 2019-03-07 DIAGNOSIS — Y29XXXA Contact with blunt object, undetermined intent, initial encounter: Secondary | ICD-10-CM | POA: Insufficient documentation

## 2019-03-07 DIAGNOSIS — Y9302 Activity, running: Secondary | ICD-10-CM | POA: Diagnosis not present

## 2019-03-07 DIAGNOSIS — S92532A Displaced fracture of distal phalanx of left lesser toe(s), initial encounter for closed fracture: Secondary | ICD-10-CM | POA: Insufficient documentation

## 2019-03-07 DIAGNOSIS — S92502A Displaced unspecified fracture of left lesser toe(s), initial encounter for closed fracture: Secondary | ICD-10-CM

## 2019-03-07 NOTE — Discharge Instructions (Addendum)
You have been seen today for pain in your toe. Please read and follow all provided instructions. Return to the emergency room for worsening condition or new concerning symptoms.    The xray today shows: mildly impacted fracture involving the distal aspect of the second proximal phalanx.   1. Medications:  Continue usual home medications Take medications as prescribed. Please review all of the medicines and only take them if you do not have an allergy to them.   2. Treatment: rest, drink plenty of fluids. Wear the post op shoe for comfort. Elevate the foot if you see swelling.  3. Follow Up:  Please follow up with primary care provider by scheduling an appointment as soon as possible for a visit  If you do not have a primary care physician, contact [facility] at [phone] for referral  -You can also follow up with podiatry (foot doctor). I have given you the office information for Dr. Amalia Hailey. Please call his office this week to schedule an appointment.   ?

## 2019-03-07 NOTE — ED Triage Notes (Signed)
Pt presents with c/o left foot pain. Pt also reports that the second toe is swollen. Pt reports she injured her foot by hitting it on a dresser.

## 2019-03-07 NOTE — ED Notes (Signed)
Buddy tape toes done.
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
## 2019-03-08 ENCOUNTER — Telehealth: Payer: Self-pay | Admitting: Podiatry

## 2019-03-08 ENCOUNTER — Other Ambulatory Visit: Payer: Self-pay

## 2019-03-08 ENCOUNTER — Ambulatory Visit: Payer: Self-pay | Admitting: Podiatry

## 2019-03-08 DIAGNOSIS — S92501A Displaced unspecified fracture of right lesser toe(s), initial encounter for closed fracture: Secondary | ICD-10-CM

## 2019-03-08 MED ORDER — TRAMADOL HCL 50 MG PO TABS: 50.0000 mg | ORAL_TABLET | Freq: Four times a day (QID) | ORAL | 0 refills | Status: AC | PRN

## 2019-03-08 NOTE — Telephone Encounter (Signed)
Pt called to see about a medication for pain that evans was gonna send to [facility] from her visit today

## 2019-03-08 NOTE — Telephone Encounter (Signed)
I informed pt of Dr. Evans orders. 

## 2019-03-08 NOTE — Telephone Encounter (Signed)
Will send now. - Dr. Amalia Hailey

## 2019-03-08 NOTE — Telephone Encounter (Signed)
No, she gets tramadol. She is also taking Mobic already. - Dr. Amalia Hailey

## 2019-03-08 NOTE — Telephone Encounter (Signed)
Pt was seen in office today and was prescribed tramadol for her pain but states that it is not strong enough to help with her pain. Pt had ask for the doctor to send in a prescription for Oxycodone.

## 2019-03-09 ENCOUNTER — Other Ambulatory Visit: Payer: Self-pay | Admitting: Family Medicine

## 2019-03-09 DIAGNOSIS — Z1231 Encounter for screening mammogram for malignant neoplasm of breast: Secondary | ICD-10-CM
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
## 2019-03-12 ENCOUNTER — Ambulatory Visit
Admission: RE | Admit: 2019-03-12 | Discharge: 2019-03-12 | Disposition: A | Discharge status: Home / Self Care | Payer: Medicaid Other | Source: Ambulatory Visit | Attending: Family Medicine | Admitting: Family Medicine

## 2019-03-12 ENCOUNTER — Other Ambulatory Visit: Payer: Self-pay

## 2019-03-12 DIAGNOSIS — Z1231 Encounter for screening mammogram for malignant neoplasm of breast: Secondary | ICD-10-CM

## 2019-03-16 ENCOUNTER — Ambulatory Visit (INDEPENDENT_AMBULATORY_CARE_PROVIDER_SITE_OTHER): Payer: Medicaid Other | Admitting: Orthopaedic Surgery

## 2019-03-16 ENCOUNTER — Encounter: Payer: Self-pay | Admitting: Orthopaedic Surgery

## 2019-03-16 ENCOUNTER — Other Ambulatory Visit: Payer: Self-pay

## 2019-03-16 DIAGNOSIS — M65312 Trigger thumb, left thumb: Secondary | ICD-10-CM

## 2019-03-16 MED ORDER — LIDOCAINE HCL 1 % IJ SOLN
0.3000 mL | INTRAMUSCULAR | Status: AC | PRN
  Administered 2019-03-16: 09:00:00 .3 mL

## 2019-03-16 MED ORDER — BUPIVACAINE HCL 0.5 % IJ SOLN
0.3300 mL | INTRAMUSCULAR | Status: AC | PRN
  Administered 2019-03-16: .33 mL

## 2019-03-16 MED ORDER — METHYLPREDNISOLONE ACETATE 40 MG/ML IJ SUSP
13.3300 mg | INTRAMUSCULAR | Status: AC | PRN
  Administered 2019-03-16: 13.33 mg
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
## 2019-04-19 ENCOUNTER — Ambulatory Visit: Payer: Self-pay | Admitting: Podiatry

## 2019-06-25 ENCOUNTER — Ambulatory Visit: Payer: Medicaid Other | Admitting: Physician Assistant

## 2019-09-08 ENCOUNTER — Encounter: Payer: Self-pay | Admitting: Cardiology

## 2019-09-08 ENCOUNTER — Telehealth: Payer: Self-pay | Admitting: Radiology

## 2019-09-08 ENCOUNTER — Telehealth: Payer: Self-pay | Admitting: *Deleted

## 2019-09-08 ENCOUNTER — Ambulatory Visit (INDEPENDENT_AMBULATORY_CARE_PROVIDER_SITE_OTHER): Payer: Medicare Other | Admitting: Cardiology

## 2019-09-08 ENCOUNTER — Other Ambulatory Visit: Payer: Self-pay

## 2019-09-08 VITALS — BP 124/78 | HR 72 | Ht 65.0 in | Wt 198.2 lb

## 2019-09-08 DIAGNOSIS — E785 Hyperlipidemia, unspecified: Secondary | ICD-10-CM | POA: Diagnosis not present

## 2019-09-08 DIAGNOSIS — R002 Palpitations: Secondary | ICD-10-CM | POA: Diagnosis not present

## 2019-09-08 DIAGNOSIS — R42 Dizziness and giddiness: Secondary | ICD-10-CM

## 2019-09-08 DIAGNOSIS — I10 Essential (primary) hypertension: Secondary | ICD-10-CM | POA: Diagnosis not present

## 2019-09-08 NOTE — Telephone Encounter (Signed)
Enrolled patient for a 14 day Zio monitor to be mailed to patients home.  
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
## 2019-09-08 NOTE — Telephone Encounter (Signed)
-----   Message from Delynn Flavin sent at 09/08/2019 10:27 AM EDT ----- Regarding: RE: 14 DAY ZIO PER NELSON Done :-) Orpha Bur ----- Message ----- From: Loa Socks, LPN Sent: 07/04/3644   9:58 AM EDT To: Ernst Bowler, Katrina Carmell [place] Subject: 14 DAY ZIO PER NELSON                          DR. Delton See JUST SAW THIS PT IN CLINIC AND ORDERED FOR HER TO GET A 14 DAY ZIO. CAN YOU PLEASE ENROLL THE PT AND LET ME KNOW?  THANKS SO MUCH, Tinsley Everman

## 2019-09-08 NOTE — Patient Instructions (Signed)
Medication Instructions:   Your physician recommends that you continue on your current medications as directed. Please refer to the Current Medication list given to you today.  *If you need a refill on your cardiac medications before your next appointment, please call your pharmacy*   Lab Work:  SAME DAY AS YOUR GXT WILL BE SCHEDULED--WE WILL CHECK CMET, CBC, TSH, AND LIPIDS--PLEASE COME FASTING TO THIS LAB APPOINTMENT  If you have labs (blood work) drawn today and your tests are completely normal, you will receive your results only by: Marland Kitchen MyChart Message (if you have MyChart) OR . A paper copy in the mail If you have any lab test that is abnormal or we need to change your treatment, we will call you to review the results.  Testing/Procedures:  Your physician has requested that you have an exercise tolerance test. For further information please visit [email or web address]. Please also follow instruction sheet, as given.  PLEASE SCHEDULE LAB SAME DAY AS THIS TEST PER DR. Nelson Chimes XT- Long Term Monitor Instructions   Your physician has requested you wear your ZIO patch monitor___14____days.   This is a single patch monitor.  [employer] supplies one patch monitor per enrollment.  Additional stickers are not available.   Please do not apply patch if you will be having a Nuclear Stress Test, Echocardiogram, Cardiac CT, MRI, or Chest Xray during the time frame you would be wearing the monitor. The patch cannot be worn during these tests.  You cannot remove and re-apply the ZIO XT patch monitor.   Your ZIO patch monitor will be sent USPS Priority mail from [facility] directly to your home address. The monitor may also be mailed to a PO BOX if home delivery is not available.   It may take 3-5 days to receive your monitor after you have been enrolled.   Once you have received you monitor, please review enclosed instructions.  Your monitor has already been registered assigning a specific  monitor serial # to you.   Applying the monitor   Shave hair from upper left chest.   Hold abrader disc by orange tab.  Rub abrader in 40 strokes over left upper chest as indicated in your monitor instructions.   Clean area with 4 enclosed alcohol pads .  Use all pads to assure are is cleaned thoroughly.  Let dry.   Apply patch as indicated in monitor instructions.  Patch will be place under collarbone on left side of chest with arrow pointing upward.   Rub patch adhesive wings for 2 minutes.Remove white label marked "1".  Remove white label marked "2".  Rub patch adhesive wings for 2 additional minutes.   While looking in a mirror, press and release button in center of patch.  A small green light will flash 3-4 times .  This will be your only indicator the monitor has been turned on.     Do not shower for the first 24 hours.  You may shower after the first 24 hours.   Press button if you feel a symptom. You will hear a small click.  Record Date, Time and Symptom in the Patient Log Book.   When you are ready to remove patch, follow instructions on last 2 pages of Patient Log Book.  Stick patch monitor onto last page of Patient Log Book.   Place Patient Log Book in [place] box.  Use locking tab on box and tape box closed securely.  The [place] box has  prepaid postage on it.  Please place in mailbox as soon as possible.  Your physician should have your test results approximately 7 days after the monitor has been mailed back to [facility].   Call [facility] Customer Care at [phone] if you have questions regarding your ZIO XT patch monitor.  Call them immediately if you see an orange light blinking on your monitor.   If your monitor falls off in less than 4 days contact our Monitor department at [phone].  If your monitor becomes loose or falls off after 4 days call [employer] at [phone] for suggestions on securing your monitor.     Follow-Up:  3 MONTHS IN THE  OFFICE WITH DR. Delton See OR DAYNA DUNN PA-C

## 2019-09-13 ENCOUNTER — Ambulatory Visit (INDEPENDENT_AMBULATORY_CARE_PROVIDER_SITE_OTHER): Payer: Medicare Other

## 2019-09-13 DIAGNOSIS — R002 Palpitations: Secondary | ICD-10-CM | POA: Diagnosis not present

## 2019-09-13 DIAGNOSIS — R42 Dizziness and giddiness: Secondary | ICD-10-CM | POA: Diagnosis not present

## 2019-09-28 ENCOUNTER — Telehealth: Payer: Self-pay | Admitting: Cardiology

## 2019-09-28 DIAGNOSIS — Z87898 Personal history of other specified conditions: Secondary | ICD-10-CM

## 2019-09-28 DIAGNOSIS — R002 Palpitations: Secondary | ICD-10-CM

## 2019-09-28 DIAGNOSIS — I10 Essential (primary) hypertension: Secondary | ICD-10-CM

## 2019-09-28 DIAGNOSIS — E785 Hyperlipidemia, unspecified: Secondary | ICD-10-CM

## 2019-09-28 DIAGNOSIS — I5032 Chronic diastolic (congestive) heart failure: Secondary | ICD-10-CM

## 2019-09-28 NOTE — Telephone Encounter (Signed)
   Pt recently moved to [place], she said she won't be in [place] anymore. She would like for Dr. Delton See to refer her to new cardiologist in [place]   Dr Beckie Busing [facility] Cardiology [phone] Fax# [phone] [address], [address] [place]

## 2019-09-28 NOTE — Telephone Encounter (Signed)
Unfortunately I do not know cardiologist in [place], but once she finds 1, we will send complete records.

## 2019-09-28 NOTE — Telephone Encounter (Signed)
    She also added, she mailed her heart monitor back yesterday 09/27/19

## 2019-09-28 NOTE — Telephone Encounter (Signed)
Will forward this message to Dr. Delton See to review and advise on.

## 2019-09-29 NOTE — Telephone Encounter (Signed)
Follow up   Per patient when the referral letter is written put in letter that patient moved to [facility] area. Please call if needed to discuss.

## 2019-09-29 NOTE — Telephone Encounter (Signed)
RE: external referral placed to Dr. Tommie Raymond in [facility] for pt moving there Received: Today Catalina Pizza, LPN [place],  Medical records isn't a part of the referrals process. A formal request is needed for [place] to release records.   [place] Noe

## 2019-09-29 NOTE — Telephone Encounter (Signed)
Referral placed externally to pts new preferred Cardiologist, Dr. Marney Doctor at [facility]. Telephone number to Dr. Docia Barrier office is [phone] and FAX- [phone]. [facility] to help facilitate this referral for the pt.  Medical Records to help facilitate getting pts medical records to Dr. Docia Barrier office.  Will send [facility] a message about referral placed.   BELOW IS FULL CONTACT INFORMATION TO DR. RIDLEY'S OFFICE  Dr Beckie Busing [facility] Cardiology [phone] Fax# [phone] [address], [address] [place]

## 2019-09-29 NOTE — Telephone Encounter (Signed)
[facility] received this referral and is working on getting this facilitated with preferred new Cardiologist, Dr. Tommie Raymond.  [facility] to arrange and Erie Noe in Medical Records is to facilitate in releasing medical records from our office, to pts new Cardiologist, once appt made.

## 2019-10-19 ENCOUNTER — Other Ambulatory Visit: Payer: Medicare Other

## 2019-11-30 ENCOUNTER — Telehealth: Payer: Self-pay | Admitting: Cardiology

## 2019-11-30 DIAGNOSIS — I471 Supraventricular tachycardia: Secondary | ICD-10-CM

## 2019-11-30 DIAGNOSIS — R9431 Abnormal electrocardiogram [ECG] [EKG]: Secondary | ICD-10-CM

## 2019-11-30 NOTE — Telephone Encounter (Signed)
Pt is calling to let Dr. Delton See and myself know that she is now staying in [place], and not moving to [place] anymore.  Pt was moving to [place] back in July and we referred her to a new Cardiologist and EP MD out there, to establish with.  We referred her there for she requested this, and to EP for noted abnormal monitor findings, done by our office and advised on by Dr. Delton See, back in July. Pt states since she is staying in [place], she would now like for Dr. Delton See to refer her to one of our EP Physicians, for abnormal monitor findings on 10/18/19, showing SVT.  Placed new referral for the pt to be referred to our EP MD's for noted SVT on monitor.  Pt is aware that I will place the referral and have our EP [facility], call her back and arrange this appt. Pt verbalized understanding and agrees with this plan.  Will send this message to Dr. Delton See as a general FYI.    Lars Masson, MD  10/18/2019 6:55 PM EDT     Predominant underlying rhythm was Sinus Rhythm. 13 Supraventricular Tachycardia runs occurred, the run with the fastest interval lasting 1 min 38 secs with a max rate of 214 bpm, the longest lasting 2 mins 6 secs with an avg rate of 134 bpm. Isolated SVEs were frequent (5.5%, 76160).  We will refer to EP for further management.

## 2019-11-30 NOTE — Telephone Encounter (Signed)
New Message:     Pt was supposed to have moved to [facility], she is back .She wants Dr Delton See to please go ahead and refer her to the EP doctor here that she wanted her to see.

## 2019-11-30 NOTE — Telephone Encounter (Signed)
Pt is now scheduled with Dr. Elberta Fortis in EP for consult on 12/24/19 at 0900.  Pt made aware of appt date and time by EP Scheduler.

## 2019-12-09 ENCOUNTER — Ambulatory Visit: Payer: Medicare Other | Admitting: Cardiology

## 2019-12-14 ENCOUNTER — Other Ambulatory Visit: Payer: Self-pay | Admitting: Anesthesiology

## 2019-12-14 DIAGNOSIS — M5030 Other cervical disc degeneration, unspecified cervical region: Secondary | ICD-10-CM

## 2019-12-14 DIAGNOSIS — M542 Cervicalgia: Secondary | ICD-10-CM

## 2019-12-24 ENCOUNTER — Encounter: Payer: Self-pay | Admitting: Cardiology

## 2019-12-24 ENCOUNTER — Other Ambulatory Visit: Payer: Self-pay

## 2019-12-24 ENCOUNTER — Ambulatory Visit (INDEPENDENT_AMBULATORY_CARE_PROVIDER_SITE_OTHER): Payer: Medicare Other | Admitting: Cardiology

## 2019-12-24 VITALS — BP 110/80 | HR 69 | Ht 65.0 in | Wt 217.0 lb

## 2019-12-24 DIAGNOSIS — I471 Supraventricular tachycardia: Secondary | ICD-10-CM

## 2019-12-24 MED ORDER — METOPROLOL SUCCINATE ER 50 MG PO TB24: 50.0000 mg | ORAL_TABLET | Freq: Every day | ORAL | 3 refills | Status: DC

## 2019-12-24 NOTE — Patient Instructions (Signed)
Medication Instructions:  Your physician has recommended you make the following change in your medication:  1. STOP Carvedilol 2. START Metoprolol Succinate (Toprol) 50 mg once daily  *If you need a refill on your cardiac medications before your next appointment, please call your pharmacy*   Lab Work: None ordered If you have labs (blood work) drawn today and your tests are completely normal, you will receive your results only by: Marland Kitchen MyChart Message (if you have MyChart) OR . A paper copy in the mail If you have any lab test that is abnormal or we need to change your treatment, we will call you to review the results.   Testing/Procedures: None ordered   Follow-Up: At [facility], you and your health needs are our priority.  As part of our continuing mission to provide you with exceptional heart care, we have created designated Provider Care Teams.  These Care Teams include your primary Cardiologist (physician) and Advanced Practice Providers (APPs -  Physician Assistants and Nurse Practitioners) who all work together to provide you with the care you need, when you need it.  We recommend signing up for the patient portal called "MyChart".  Sign up information is provided on this After Visit Summary.  MyChart is used to connect with patients for Virtual Visits (Telemedicine).  Patients are able to view lab/test results, encounter notes, upcoming appointments, etc.  Non-urgent messages can be sent to your provider as well.   To learn more about what you can do with MyChart, go to [email or web address].    Your next appointment:   3 month(s)  The format for your next appointment:   In Person  Provider:   Loman Brooklyn, MD   Other Instructions   Cardiac Ablation  Cardiac ablation is a procedure to stop some heart tissue from causing problems. The heart has many electrical connections. Sometimes these connections make the heart beat very fast or irregularly. Removing some  problem areas can improve the heart rhythm or make it normal. What happens before the procedure?  Follow instructions from your doctor about what you cannot eat or drink.  Ask your doctor about: ? Changing or stopping your normal medicines. This is important if you take diabetes medicines or blood thinners. ? Taking medicines such as aspirin and ibuprofen. These medicines can thin your blood. Do not take these medicines before your procedure if your doctor tells you not to.  Plan to have someone take you home.  If you will be going home right after the procedure, plan to have someone with you for 24 hours. What happens during the procedure?  To lower your risk of infection: ? Your health care team will wash or sanitize their hands. ? Your skin will be washed with soap. ? Hair may be removed from your neck or groin.  An IV tube will be put into one of your veins.  You will be given a medicine to help you relax (sedative).  Skin on your neck or groin will be numbed.  A cut (incision) will be made in your neck or groin.  A needle will be put through your cut and into a vein in your neck or groin.  A tube (catheter) will be put into the needle. The tube will be moved to your heart. X-rays (fluoroscopy) will be used to help guide the tube.  Small devices (electrodes) on the tip of the tube will send out electrical currents.  Dye may be put through the tube. This helps your  surgeon see your heart.  Electrical energy will be used to scar (ablate) some heart tissue. Your surgeon may use: ? Heat (radiofrequency energy). ? Laser energy. ? Extreme cold (cryoablation).  The tube will be taken out.  Pressure will be held on your cut. This helps stop bleeding.  A bandage (dressing) will be put on your cut. The procedure may vary. What happens after the procedure?  You will be monitored until your medicines have worn off.  Your cut will be watched for bleeding. You will need to lie  still for a few hours.  Do not drive for 24 hours or as long as your doctor tells you. Summary  Cardiac ablation is a procedure to stop some heart tissue from causing problems.  Electrical energy will be used to scar (ablate) some heart tissue. This information is not intended to replace advice given to you by your health care provider. Make sure you discuss any questions you have with your health care provider. Document Revised: 02/28/2017 Document Reviewed: 02/05/2016 Elsevier Patient Education  2020 [employer].
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
## 2020-01-16 ENCOUNTER — Encounter (HOSPITAL_COMMUNITY): Payer: Self-pay | Admitting: *Deleted

## 2020-01-16 ENCOUNTER — Ambulatory Visit (HOSPITAL_COMMUNITY)
Admission: EM | Admit: 2020-01-16 | Discharge: 2020-01-16 | Disposition: A | Discharge status: Home / Self Care | Payer: Medicare Other | Attending: Emergency Medicine | Admitting: Emergency Medicine

## 2020-01-16 ENCOUNTER — Other Ambulatory Visit: Payer: Self-pay

## 2020-01-16 ENCOUNTER — Ambulatory Visit (INDEPENDENT_AMBULATORY_CARE_PROVIDER_SITE_OTHER): Payer: Medicare Other

## 2020-01-16 DIAGNOSIS — J1282 Pneumonia due to coronavirus disease 2019: Secondary | ICD-10-CM | POA: Diagnosis not present

## 2020-01-16 DIAGNOSIS — R0602 Shortness of breath: Secondary | ICD-10-CM | POA: Diagnosis not present

## 2020-01-16 DIAGNOSIS — U071 COVID-19: Secondary | ICD-10-CM | POA: Diagnosis not present

## 2020-01-16 DIAGNOSIS — J189 Pneumonia, unspecified organism: Secondary | ICD-10-CM

## 2020-01-16 DIAGNOSIS — R059 Cough, unspecified: Secondary | ICD-10-CM | POA: Diagnosis not present

## 2020-01-16 LAB — SARS CORONAVIRUS 2 (TAT 6-24 HRS): SARS Coronavirus 2: POSITIVE — AB

## 2020-01-16 MED ORDER — AMOXICILLIN 500 MG PO CAPS: 1000.0000 mg | ORAL_CAPSULE | Freq: Three times a day (TID) | ORAL | 0 refills | Status: AC

## 2020-01-16 MED ORDER — BENZONATATE 200 MG PO CAPS: 200.0000 mg | ORAL_CAPSULE | Freq: Three times a day (TID) | ORAL | 0 refills | Status: AC | PRN

## 2020-01-16 MED ORDER — AZITHROMYCIN 250 MG PO TABS: 250.0000 mg | ORAL_TABLET | Freq: Every day | ORAL | 0 refills | Status: DC
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
## 2020-01-16 NOTE — ED Triage Notes (Signed)
C/O starting with nasal congestion approx 1 wk ago which progressed into having a cough 5 days ago.  4 days ago started with right mid back pain with any deep breathing.  States feels SOB because it hurts to breath.  Denies fevers.  States feels like when she had pneumonia.

## 2020-01-16 NOTE — Discharge Instructions (Signed)
Covid test pending, monitor my chart for results Begin amoxicillin x1 week Azithromycin-2 tablets today, 1 tablet for the following 4 days Tessalon for cough, continue Mucinex Ibuprofen and Tylenol as needed for back discomfort Rest and fluids Follow-up if not improving or worsening

## 2020-01-17 ENCOUNTER — Telehealth: Payer: Self-pay | Admitting: Unknown Physician Specialty

## 2020-01-17 ENCOUNTER — Telehealth (HOSPITAL_COMMUNITY): Payer: Self-pay | Admitting: Emergency Medicine

## 2020-01-17 NOTE — Telephone Encounter (Signed)
Called to Discuss with patient about Covid symptoms and the use of the monoclonal antibody infusion for those with mild to moderate Covid symptoms and at a high risk of hospitalization.     Pt appears to qualify for this infusion due to co-morbid conditions and/or a member of an at-risk group in accordance with the FDA Emergency Use Authorization.    Unable to reach pt  LMOM and mychart 

## 2020-01-17 NOTE — Telephone Encounter (Signed)
Received call back from patient, results verified with patient, infusion clinic hotline number given to patient, and ER precautions reviewed.  Patient verbalized understanding.

## 2020-01-18 ENCOUNTER — Telehealth (HOSPITAL_COMMUNITY): Payer: Self-pay

## 2020-01-18 ENCOUNTER — Other Ambulatory Visit: Payer: Self-pay | Admitting: Nurse Practitioner

## 2020-01-18 DIAGNOSIS — U071 COVID-19: Secondary | ICD-10-CM

## 2020-01-18 NOTE — Progress Notes (Signed)
I connected by phone with [name] [name] on 01/18/2020 at 6:26 PM to discuss the potential use of a new treatment for mild to moderate COVID-19 viral infection in non-hospitalized patients.  This patient is a 59 y.o. female that meets the FDA criteria for Emergency Use Authorization of COVID monoclonal antibody casirivimab/imdevimab or bamlanivimab/eteseviamb.  Has a (+) direct SARS-CoV-2 viral test result  Has mild or moderate COVID-19   Is NOT hospitalized due to COVID-19  Is within 10 days of symptom onset  Has at least one of the high risk factor(s) for progression to severe COVID-19 and/or hospitalization as defined in EUA.  Specific high risk criteria : BMI > 25 and Cardiovascular disease or hypertension   I have spoken and communicated the following to the patient or parent/caregiver regarding COVID monoclonal antibody treatment:  1. FDA has authorized the emergency use for the treatment of mild to moderate COVID-19 in adults and pediatric patients with positive results of direct SARS-CoV-2 viral testing who are 61 years of age and older weighing at least 40 kg, and who are at high risk for progressing to severe COVID-19 and/or hospitalization.  2. The significant known and potential risks and benefits of COVID monoclonal antibody, and the extent to which such potential risks and benefits are unknown.  3. Information on available alternative treatments and the risks and benefits of those alternatives, including clinical trials.  4. Patients treated with COVID monoclonal antibody should continue to self-isolate and use infection control measures (e.g., wear mask, isolate, social distance, avoid sharing personal items, clean and disinfect "high touch" surfaces, and frequent handwashing) according to CDC guidelines.   5. The patient or parent/caregiver has the option to accept or refuse COVID monoclonal antibody treatment.  After reviewing this information with the  patient, the patient has agreed to receive one of the available covid 19 monoclonal antibodies and will be provided an appropriate fact sheet prior to infusion. [name], NP 01/18/2020 6:26 PM

## 2020-01-18 NOTE — Telephone Encounter (Signed)
Called to Discuss with patient about Covid symptoms and the use of the monoclonal antibody infusion for those with mild to moderate Covid symptoms and at a high risk of hospitalization.     Pt appears to qualify for this infusion due to co-morbid conditions and/or a member of an at-risk group in accordance with the FDA Emergency Use Authorization.    Unable to reach pt    

## 2020-01-19 ENCOUNTER — Ambulatory Visit (HOSPITAL_COMMUNITY)
Admission: RE | Admit: 2020-01-19 | Discharge: 2020-01-19 | Disposition: A | Discharge status: Home / Self Care | Payer: Medicare Other | Source: Ambulatory Visit | Attending: Pulmonary Disease | Admitting: Pulmonary Disease

## 2020-01-19 DIAGNOSIS — Z23 Encounter for immunization: Secondary | ICD-10-CM | POA: Diagnosis not present

## 2020-01-19 DIAGNOSIS — U071 COVID-19: Secondary | ICD-10-CM | POA: Diagnosis present

## 2020-01-19 MED ORDER — FAMOTIDINE IN NACL 20-0.9 MG/50ML-% IV SOLN: 20.0000 mg | Freq: Once | INTRAVENOUS | Status: DC | PRN

## 2020-01-19 MED ORDER — METHYLPREDNISOLONE SODIUM SUCC 125 MG IJ SOLR: 125.0000 mg | Freq: Once | INTRAMUSCULAR | Status: DC | PRN

## 2020-01-19 MED ORDER — DIPHENHYDRAMINE HCL 50 MG/ML IJ SOLN: 50.0000 mg | Freq: Once | INTRAMUSCULAR | Status: DC | PRN

## 2020-01-19 MED ORDER — SODIUM CHLORIDE 0.9 % IV SOLN: INTRAVENOUS | Status: DC | PRN

## 2020-01-19 MED ORDER — ALBUTEROL SULFATE HFA 108 (90 BASE) MCG/ACT IN AERS: 2.0000 | INHALATION_SPRAY | Freq: Once | RESPIRATORY_TRACT | Status: DC | PRN

## 2020-01-19 MED ORDER — EPINEPHRINE 0.3 MG/0.3ML IJ SOAJ: 0.3000 mg | Freq: Once | INTRAMUSCULAR | Status: DC | PRN

## 2020-01-19 MED ORDER — SODIUM CHLORIDE 0.9 % IV SOLN: Freq: Once | INTRAVENOUS | Status: AC

## 2020-01-19 NOTE — Discharge Instructions (Signed)

## 2020-01-19 NOTE — Progress Notes (Signed)
  Diagnosis: COVID-19  Physician: Dr. Delford Field  Procedure: Covid Infusion Clinic Med: bamlanivimab\etesevimab infusion - Provided patient with bamlanimivab\etesevimab fact sheet for patients, parents and caregivers prior to infusion.  Complications: No immediate complications noted.  Discharge: Discharged home   Ab Leaming Mliss Sax 01/19/2020

## 2020-03-06 ENCOUNTER — Other Ambulatory Visit: Payer: Self-pay | Admitting: Internal Medicine

## 2020-03-06 DIAGNOSIS — Z1231 Encounter for screening mammogram for malignant neoplasm of breast: Secondary | ICD-10-CM

## 2020-03-10 ENCOUNTER — Ambulatory Visit: Payer: Medicaid Other | Admitting: Podiatry

## 2020-03-10 ENCOUNTER — Telehealth: Payer: Self-pay | Admitting: Cardiology

## 2020-03-10 NOTE — Telephone Encounter (Signed)
Pt c/o Shortness Of Breath: STAT if SOB developed within the last 24 hours or pt is noticeably SOB on the phone  1. Are you currently SOB (can you hear that pt is SOB on the phone)? A little right now  2. How long have you been experiencing SOB?  Last few weeks  3. Are you SOB when sitting or when up moving around? When moving around  4. Are you currently experiencing any other symptoms?  Heart racing- wants to be seen- I scheduled pt for 03-14-20 with Dr Delton See

## 2020-03-10 NOTE — Telephone Encounter (Signed)
Pt is calling in to let Dr. Delton See and Dr. Elberta Fortis know that over the past few days, she has been having heart palpitations, fluttering in her chest, intermittent sob, exertional dizziness, and fast HR.  Pt states with any activity that requires exertion, she gets easily sob and dizzy. Pt denies chest pain, N/V, diaphoresis, pre-syncopal or syncopal episodes.  She denies weakness or headache. Pt states she did take her BP/HR this morning, but is not sure the accuracy of her monitor.  She reports BP-178/100 and HR-106 BPM. Pt states she is taking all her medications as prescribed.  Pt states she is usually able to calm herself down when these episodes occur, but she voiced if they come back and worsen, she will have her husband take her to the ER for further evaluation. Pt states she is scheduled to see Dr. Elberta Fortis for follow-up on 04/06/20, and to discuss possibly getting scheduled to have an SVT ablation done.  Pt states she is now in favor of doing that.  Advised the pt to continue her current regimen, and go ahead and take her Toprol XL 50 mg po daily now, if she hasn't already. Advised the pt to hydrate well, take it easy and avoid any strenuous activity and exertion. Advised her to avoid caffeine, soda, chocolate, nicotine is used.  Advised her to limit her sodium intake.  ED precautions provided to the pt if symptoms were to persist or worsen between now and her follow-up appt with Dr. Delton See next Tuesday 12/14.  Pt will also follow-up as planned with Dr. Elberta Fortis on 04/06/20.  Informed the pt that I will route this message to both Dr. Elberta Fortis and Dr. Delton See for further advisement, for they may want to increase or change her med regimen around, in the interim while she is waiting to be seen next week. Informed the pt that either I or Dr. Elberta Fortis RN will follow-up with her accordingly thereafter, once further recommendations are received.  Pt verbalized understanding and agrees with this plan.

## 2020-03-11 NOTE — Telephone Encounter (Signed)
She should go to the ER if the symptoms get worse over the weekend, otherwise I will see her on 12/14

## 2020-03-13 NOTE — Telephone Encounter (Signed)
Pt made aware of this plan last week.  Pt will see [place] as planned for 03/14/20.

## 2020-03-14 ENCOUNTER — Other Ambulatory Visit: Payer: Self-pay

## 2020-03-14 ENCOUNTER — Encounter: Payer: Self-pay | Admitting: Cardiology

## 2020-03-14 ENCOUNTER — Ambulatory Visit (INDEPENDENT_AMBULATORY_CARE_PROVIDER_SITE_OTHER): Payer: Medicare Other | Admitting: Cardiology

## 2020-03-14 VITALS — BP 128/90 | HR 68 | Ht 65.0 in | Wt 209.2 lb

## 2020-03-14 DIAGNOSIS — R0609 Other forms of dyspnea: Secondary | ICD-10-CM

## 2020-03-14 DIAGNOSIS — I10 Essential (primary) hypertension: Secondary | ICD-10-CM | POA: Diagnosis not present

## 2020-03-14 DIAGNOSIS — R42 Dizziness and giddiness: Secondary | ICD-10-CM

## 2020-03-14 DIAGNOSIS — R0600 Dyspnea, unspecified: Secondary | ICD-10-CM | POA: Diagnosis not present

## 2020-03-14 DIAGNOSIS — I471 Supraventricular tachycardia: Secondary | ICD-10-CM

## 2020-03-14 MED ORDER — METOPROLOL SUCCINATE ER 50 MG PO TB24: 50.0000 mg | ORAL_TABLET | Freq: Two times a day (BID) | ORAL | 1 refills | Status: DC

## 2020-03-14 NOTE — Patient Instructions (Signed)
Medication Instructions:   INCREASE YOUR METOPROLOL SUCCINATE TO 50 MG BY MOUTH TWICE DAILY  *If you need a refill on your cardiac medications before your next appointment, please call your pharmacy*   Testing/Procedures:  Your cardiac CT will be scheduled at one of the below locations:   [facility] [address] [place], [place] [address] [phone]  If scheduled at [facility], please arrive at the [facility] main entrance of [facility] 30 minutes prior to test start time. Proceed to the [facility] Radiology Department (first floor) to check-in and test prep.  Please follow these instructions carefully (unless otherwise directed):  On the Night Before the Test: . Be sure to Drink plenty of water. . Do not consume any caffeinated/decaffeinated beverages or chocolate 12 hours prior to your test. . Do not take any antihistamines 12 hours prior to your test  On the Day of the Test: . Drink plenty of water. Do not drink any water within one hour of the test. . Do not eat any food 4 hours prior to the test. . You may take your regular medications prior to the test.  . Take YOUR metoprolol  (Lopressor) two hours prior to test.  PLEASE TAKE YOUR SCHEDULED METOPROLOL THE MORNING OF THE TEST. Marland Kitchen FEMALES- please wear underwire-free bra if available   After the Test: . Drink plenty of water. . After receiving IV contrast, you may experience a mild flushed feeling. This is normal. . On occasion, you may experience a mild rash up to 24 hours after the test. This is not dangerous. If this occurs, you can take Benadryl 25 mg and increase your fluid intake. . If you experience trouble breathing, this can be serious. If it is severe call 911 IMMEDIATELY. If it is mild, please call our office.  Once we have confirmed authorization from your insurance company, we will call you to set up a date and time for your test. Based on how quickly your insurance  processes prior authorizations requests, please allow up to 4 weeks to be contacted for scheduling your Cardiac CT appointment. Be advised that routine Cardiac CT appointments could be scheduled as many as 8 weeks after your provider has ordered it.  For non-scheduling related questions, please contact the cardiac imaging nurse navigator should you have any questions/concerns: Marchia Bond, Cardiac Imaging Nurse Navigator Burley Saver, Interim Cardiac Imaging Nurse [facility] Direct Office Dial: [phone]   For scheduling needs, including cancellations and rescheduling, please call [place], [phone].   Follow-Up:  BEGINNING OF MAY 2022 IN THE OFFICE WITH DR. Meda Coffee
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
## 2020-03-17 ENCOUNTER — Ambulatory Visit (INDEPENDENT_AMBULATORY_CARE_PROVIDER_SITE_OTHER): Payer: Medicare Other | Admitting: Podiatry

## 2020-03-17 ENCOUNTER — Other Ambulatory Visit: Payer: Self-pay

## 2020-03-17 DIAGNOSIS — E2839 Other primary ovarian failure: Secondary | ICD-10-CM | POA: Insufficient documentation

## 2020-03-17 DIAGNOSIS — M79675 Pain in left toe(s): Secondary | ICD-10-CM | POA: Diagnosis not present

## 2020-03-17 DIAGNOSIS — B351 Tinea unguium: Secondary | ICD-10-CM

## 2020-03-17 DIAGNOSIS — M47817 Spondylosis without myelopathy or radiculopathy, lumbosacral region: Secondary | ICD-10-CM | POA: Insufficient documentation

## 2020-03-17 DIAGNOSIS — L603 Nail dystrophy: Secondary | ICD-10-CM

## 2020-03-17 DIAGNOSIS — G894 Chronic pain syndrome: Secondary | ICD-10-CM | POA: Insufficient documentation

## 2020-03-17 DIAGNOSIS — I10 Essential (primary) hypertension: Secondary | ICD-10-CM | POA: Insufficient documentation

## 2020-03-17 DIAGNOSIS — M2042 Other hammer toe(s) (acquired), left foot: Secondary | ICD-10-CM

## 2020-03-17 DIAGNOSIS — M47812 Spondylosis without myelopathy or radiculopathy, cervical region: Secondary | ICD-10-CM | POA: Insufficient documentation

## 2020-03-17 DIAGNOSIS — D500 Iron deficiency anemia secondary to blood loss (chronic): Secondary | ICD-10-CM | POA: Insufficient documentation

## 2020-03-17 DIAGNOSIS — I471 Supraventricular tachycardia: Secondary | ICD-10-CM | POA: Insufficient documentation

## 2020-03-17 MED ORDER — FLUCONAZOLE 150 MG PO TABS: 150.0000 mg | ORAL_TABLET | ORAL | 2 refills | Status: DC

## 2020-03-17 MED ORDER — CICLOPIROX 8 % EX SOLN: Freq: Every day | CUTANEOUS | 0 refills | Status: DC

## 2020-03-17 NOTE — Progress Notes (Signed)
  Subjective:  Patient ID: [name] [name], female    DOB: 03/08/1961,  MRN: [id number]  Chief Complaint  Patient presents with  . Nail Problem    Nail trim 1-5 bilateral    59 y.o. female presents with the above complaint. History confirmed with patient. Has tried OTC medications without relief. Hx >10 years.  Secondary complaint at end of visit - has hx of break to the left 2nd toe, states it is painful and stiff. Would like to discuss options  Objective:  Physical Exam: warm, good capillary refill, nail exam onychomycosis of the toenails left>right, no trophic changes or ulcerative lesions, normal DP and PT pulses and normal sensory exam. Left 2nd toe stiffened across the PIPJ with POP and hammertoe contracture. Assessment:   1. Onychomycosis   2. Hammer toe of left foot   3. Pain in toe of left foot   4. Nail dystrophy      Plan:  Patient was evaluated and treated and all questions answered.  Onychomycosis -Educated on etiology of nail fungus. -Rx fluconazole weekly and Penlac. Educated on use and r/b. -Nails debrided, courtesy  Hammertoe left 2nd toe with hx of fracture -Will get XR next visit and discuss surgical intervention   Return in about 2 months (around 05/18/2020) for Nail Fungus, surgical consult for left 2nd hammertoe, with XRs.

## 2020-03-20 ENCOUNTER — Ambulatory Visit
Admission: RE | Admit: 2020-03-20 | Discharge: 2020-03-20 | Disposition: A | Discharge status: Home / Self Care | Payer: Medicare Other | Source: Ambulatory Visit | Attending: Internal Medicine | Admitting: Internal Medicine

## 2020-03-20 ENCOUNTER — Other Ambulatory Visit: Payer: Self-pay

## 2020-03-20 DIAGNOSIS — Z1231 Encounter for screening mammogram for malignant neoplasm of breast: Secondary | ICD-10-CM

## 2020-04-06 ENCOUNTER — Encounter: Payer: Self-pay | Admitting: Cardiology

## 2020-04-06 ENCOUNTER — Ambulatory Visit (INDEPENDENT_AMBULATORY_CARE_PROVIDER_SITE_OTHER): Payer: Medicare Other | Admitting: Cardiology

## 2020-04-06 ENCOUNTER — Other Ambulatory Visit: Payer: Self-pay

## 2020-04-06 VITALS — BP 126/86 | HR 67 | Ht 65.0 in | Wt 210.0 lb

## 2020-04-06 DIAGNOSIS — H40013 Open angle with borderline findings, low risk, bilateral: Secondary | ICD-10-CM | POA: Diagnosis not present

## 2020-04-06 DIAGNOSIS — I471 Supraventricular tachycardia: Secondary | ICD-10-CM | POA: Diagnosis not present

## 2020-04-06 NOTE — Patient Instructions (Signed)
Medication Instructions:  Your physician recommends that you continue on your current medications as directed. Please refer to the Current Medication list given to you today.  *If you need a refill on your cardiac medications before your next appointment, please call your pharmacy*   Lab Work: None ordered   Testing/Procedures: Your physician has recommended that you have an ablation. Catheter ablation is a medical procedure used to treat some cardiac arrhythmias (irregular heartbeats). During catheter ablation, a long, thin, flexible tube is put into a blood vessel in your groin (upper thigh), or neck. This tube is called an ablation catheter. It is then guided to your heart through the blood vessel. Radio frequency waves destroy small areas of heart tissue where abnormal heartbeats may cause an arrhythmia to start. Please see the instructions below located under "other instructions".  Follow-Up: At [facility], you and your health needs are our priority.  As part of our continuing mission to provide you with exceptional heart care, we have created designated Provider Care Teams.  These Care Teams include your primary Cardiologist (physician) and Advanced Practice Providers (APPs -  Physician Assistants and Nurse Practitioners) who all work together to provide you with the care you need, when you need it.  Your next appointment:    to be determined once ablation is scheduled  The format for your next appointment:   In Person  Provider:   Loman Brooklyn, MD    Thank you for choosing [facility]!!   Dory Horn, RN [phone]   Other Instructions     Electrophysiology/Ablation Procedure Instructions   You are scheduled for a(n)  ablation on ____________ with Dr. Loman Brooklyn.   1.   Pre procedure testing-             A.  LAB WORK --- On _____________ for your pre procedure blood work.  You do NOT need to be fasting.               B. COVID TEST-- On ___________ @  __________ - This is a Drive Up Visit at [address] [place], [place] [address].  Someone will direct you to the appropriate testing line. Stay in your car and someone will be with you shortly.   After you are tested please go home and self quarantine until the day of your procedure.     2. On the day of your procedure ___________ you will go to [facility] hospital [phone]) at _________.  You will go to the main entrance [address] [employer]) and enter where the [employer] parking staff are.  Your driver will drop you off and you will head down the hallway to ADMITTING.  You may have one support person come in to the hospital with you.  They will be asked to wait in the waiting room.   3.   Do not eat or drink after midnight prior to your procedure.   4.   Do not miss any doses of your blood thinner prior to the morning of your procedure or your procedure will need to be rescheduled.       Do NOT take any medications the morning of your procedure.   5.  Plan for an overnight stay, but you may be discharged home after your procedure.    If you use your phone frequently bring your phone charger, in case you have to stay.  If you are discharged after your procedure you will need someone to  drive you home and be with your for 24 hours after your procedure.   6. You will follow up with the AFIB clinic 4 weeks after your procedure.  You will follow up with Dr. Curt Bears  3 months after your procedure.  These appointments will be made for you.   * If you have ANY questions please call the office [phone] and ask for Olla Delancey RN or send me a MyChart message   * Occasionally, EP Studies and ablations can become lengthy.  Please make your family aware of this before your procedure starts.  Average time ranges from 2-8 hours for EP studies/ablations.  Your physician will call your family after the procedure with the results.                                     Cardiac Ablation Cardiac ablation is a  procedure to disable (ablate) a small amount of heart tissue in very specific places. The heart has many electrical connections. Sometimes these connections are abnormal and can cause the heart to beat very fast or irregularly. Ablating some of the problem areas can improve the heart rhythm or return it to normal. Ablation may be done for people who:  Have Wolff-Parkinson-White syndrome.  Have fast heart rhythms (tachycardia).  Have taken medicines for an abnormal heart rhythm (arrhythmia) that were not effective or caused side effects.  Have a high-risk heartbeat that may be life-threatening. During the procedure, a small incision is made in the neck or the groin, and a long, thin, flexible tube (catheter) is inserted into the incision and moved to the heart. Small devices (electrodes) on the tip of the catheter will send out electrical currents. A type of X-ray (fluoroscopy) will be used to help guide the catheter and to provide images of the heart. Tell a health care provider about:  Any allergies you have.  All medicines you are taking, including vitamins, herbs, eye drops, creams, and over-the-counter medicines.  Any problems you or family members have had with anesthetic medicines.  Any blood disorders you have.  Any surgeries you have had.  Any medical conditions you have, such as kidney failure.  Whether you are pregnant or may be pregnant. What are the risks? Generally, this is a safe procedure. However, problems may occur, including:  Infection.  Bruising and bleeding at the catheter insertion site.  Bleeding into the chest, especially into the sac that surrounds the heart. This is a serious complication.  Stroke or blood clots.  Damage to other structures or organs.  Allergic reaction to medicines or dyes.  Need for a permanent pacemaker if the normal electrical system is damaged. A pacemaker is a small computer that sends electrical signals to the heart and helps  your heart beat normally.  The procedure not being fully effective. This may not be recognized until months later. Repeat ablation procedures are sometimes required. What happens before the procedure?  Follow instructions from your health care provider about eating or drinking restrictions.  Ask your health care provider about: ? Changing or stopping your regular medicines. This is especially important if you are taking diabetes medicines or blood thinners. ? Taking medicines such as aspirin and ibuprofen. These medicines can thin your blood. Do not take these medicines before your procedure if your health care provider instructs you not to.  Plan to have someone take you home from the hospital or  clinic.  If you will be going home right after the procedure, plan to have someone with you for 24 hours. What happens during the procedure?  To lower your risk of infection: ? Your health care team will wash or sanitize their hands. ? Your skin will be washed with soap. ? Hair may be removed from the incision area.  An IV tube will be inserted into one of your veins.  You will be given a medicine to help you relax (sedative).  The skin on your neck or groin will be numbed.  An incision will be made in your neck or your groin.  A needle will be inserted through the incision and into a large vein in your neck or groin.  A catheter will be inserted into the needle and moved to your heart.  Dye may be injected through the catheter to help your surgeon see the area of the heart that needs treatment.  Electrical currents will be sent from the catheter to ablate heart tissue in desired areas. There are three types of energy that may be used to ablate heart tissue: ? Heat (radiofrequency energy). ? Laser energy. ? Extreme cold (cryoablation).  When the necessary tissue has been ablated, the catheter will be removed.  Pressure will be held on the catheter insertion area to prevent  excessive bleeding.  A bandage (dressing) will be placed over the catheter insertion area. The procedure may vary among health care providers and hospitals. What happens after the procedure?  Your blood pressure, heart rate, breathing rate, and blood oxygen level will be monitored until the medicines you were given have worn off.  Your catheter insertion area will be monitored for bleeding. You will need to lie still for a few hours to ensure that you do not bleed from the catheter insertion area.  Do not drive for 24 hours or as long as directed by your health care provider. Summary  Cardiac ablation is a procedure to disable (ablate) a small amount of heart tissue in very specific places. Ablating some of the problem areas can improve the heart rhythm or return it to normal.  During the procedure, electrical currents will be sent from the catheter to ablate heart tissue in desired areas. This information is not intended to replace advice given to you by your health care provider. Make sure you discuss any questions you have with your health care provider. Document Revised: 09/08/2017 Document Reviewed: 02/05/2016 Elsevier Patient Education  2020 [employer].
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
## 2020-04-10 ENCOUNTER — Telehealth: Payer: Self-pay | Admitting: *Deleted

## 2020-04-10 DIAGNOSIS — I5032 Chronic diastolic (congestive) heart failure: Secondary | ICD-10-CM

## 2020-04-10 DIAGNOSIS — R0600 Dyspnea, unspecified: Secondary | ICD-10-CM

## 2020-04-10 DIAGNOSIS — I471 Supraventricular tachycardia: Secondary | ICD-10-CM

## 2020-04-10 DIAGNOSIS — R0609 Other forms of dyspnea: Secondary | ICD-10-CM

## 2020-04-10 DIAGNOSIS — Z0189 Encounter for other specified special examinations: Secondary | ICD-10-CM

## 2020-04-10 DIAGNOSIS — E785 Hyperlipidemia, unspecified: Secondary | ICD-10-CM

## 2020-04-10 DIAGNOSIS — Z87898 Personal history of other specified conditions: Secondary | ICD-10-CM

## 2020-04-10 NOTE — Telephone Encounter (Signed)
Order for BMET placed and pt has a scheduled lab appt to have this done for this Friday 04/14/20, at our office.  This is for upcoming Cardiac CT scheduled for 1/18, and per CT protocol.  Pt verbalized understanding and agrees with this plan.

## 2020-04-10 NOTE — Telephone Encounter (Signed)
-----   Message from [place] sent at 04/07/2020 11:56 AM EST ----- Regarding: ct heart Schedule 1/18 at 1:00  Pt will need labs done.  Thanks, [place]

## 2020-04-11 ENCOUNTER — Telehealth: Payer: Self-pay | Admitting: Cardiology

## 2020-04-11 NOTE — Telephone Encounter (Signed)
Left detailed message that I would call back to get procedure scheduled. (made aware I was out of the office tomorrow)

## 2020-04-11 NOTE — Telephone Encounter (Signed)
Patient is calling to speak to Dr. Elberta Fortis nurse about a procedure they discussed at her last appointment. Please advise.

## 2020-04-12 DIAGNOSIS — Z23 Encounter for immunization: Secondary | ICD-10-CM | POA: Diagnosis not present

## 2020-04-14 ENCOUNTER — Other Ambulatory Visit: Payer: Medicare Other

## 2020-04-14 ENCOUNTER — Telehealth (HOSPITAL_COMMUNITY): Payer: Self-pay | Admitting: *Deleted

## 2020-04-14 NOTE — Telephone Encounter (Signed)
Reaching out to patient to offer assistance regarding upcoming cardiac imaging study; pt verbalizes understanding of appt date/time, parking situation and where to check in, pre-test NPO status and medications ordered, and verified current allergies; name and call back number provided for further questions should they arise  Knut Rondinelli RN Navigator Cardiac Imaging [facility] [phone] office [phone] cell  

## 2020-04-17 ENCOUNTER — Other Ambulatory Visit: Payer: Medicare Other

## 2020-04-18 ENCOUNTER — Ambulatory Visit (HOSPITAL_COMMUNITY): Admission: RE | Admit: 2020-04-18 | Payer: Medicare Other | Source: Ambulatory Visit

## 2020-04-18 ENCOUNTER — Other Ambulatory Visit: Payer: Medicare Other

## 2020-04-18 ENCOUNTER — Ambulatory Visit (HOSPITAL_COMMUNITY): Payer: Medicare Other

## 2020-04-21 ENCOUNTER — Telehealth: Payer: Self-pay | Admitting: Cardiology

## 2020-04-21 NOTE — Telephone Encounter (Signed)
Held 3/9 procedure date. Aware I will confirm date ok w/ Dr. Elberta Fortis and let her know. Pt agreeable to plan.

## 2020-04-21 NOTE — Telephone Encounter (Signed)
New Message:     Pt would like to know the name of the procedure she is supposed to have?

## 2020-04-21 NOTE — Telephone Encounter (Signed)
Advised "SVT ablation". Pt appreciates my return call

## 2020-04-25 ENCOUNTER — Other Ambulatory Visit: Payer: Medicare Other

## 2020-04-25 DIAGNOSIS — G8929 Other chronic pain: Secondary | ICD-10-CM | POA: Diagnosis not present

## 2020-04-25 DIAGNOSIS — M545 Low back pain, unspecified: Secondary | ICD-10-CM | POA: Diagnosis not present

## 2020-04-25 DIAGNOSIS — Z79891 Long term (current) use of opiate analgesic: Secondary | ICD-10-CM | POA: Diagnosis not present

## 2020-04-25 DIAGNOSIS — M5412 Radiculopathy, cervical region: Secondary | ICD-10-CM | POA: Diagnosis not present

## 2020-04-25 DIAGNOSIS — M542 Cervicalgia: Secondary | ICD-10-CM | POA: Diagnosis not present

## 2020-04-25 DIAGNOSIS — M5136 Other intervertebral disc degeneration, lumbar region: Secondary | ICD-10-CM | POA: Diagnosis not present

## 2020-04-25 DIAGNOSIS — G894 Chronic pain syndrome: Secondary | ICD-10-CM | POA: Diagnosis not present

## 2020-04-28 ENCOUNTER — Other Ambulatory Visit: Payer: Medicare Other | Admitting: *Deleted

## 2020-04-28 ENCOUNTER — Other Ambulatory Visit: Payer: Self-pay

## 2020-04-28 DIAGNOSIS — R0600 Dyspnea, unspecified: Secondary | ICD-10-CM | POA: Diagnosis not present

## 2020-04-28 DIAGNOSIS — Z87898 Personal history of other specified conditions: Secondary | ICD-10-CM

## 2020-04-28 DIAGNOSIS — I5032 Chronic diastolic (congestive) heart failure: Secondary | ICD-10-CM

## 2020-04-28 DIAGNOSIS — R0609 Other forms of dyspnea: Secondary | ICD-10-CM

## 2020-04-28 DIAGNOSIS — Z0189 Encounter for other specified special examinations: Secondary | ICD-10-CM

## 2020-04-28 DIAGNOSIS — I471 Supraventricular tachycardia: Secondary | ICD-10-CM | POA: Diagnosis not present

## 2020-04-28 DIAGNOSIS — E785 Hyperlipidemia, unspecified: Secondary | ICD-10-CM

## 2020-04-28 LAB — BASIC METABOLIC PANEL
BUN/Creatinine Ratio: 12 (ref 12–28)
BUN: 12 mg/dL (ref 8–27)
CO2: 23 mmol/L (ref 20–29)
Calcium: 9.5 mg/dL (ref 8.7–10.3)
Chloride: 101 mmol/L (ref 96–106)
Creatinine, Ser: 0.97 mg/dL (ref 0.57–1.00)
GFR calc Af Amer: 73 mL/min/{1.73_m2} (ref >59)
GFR calc non Af Amer: 63 mL/min/{1.73_m2} (ref >59)
Glucose: 98 mg/dL (ref 65–99)
Potassium: 4.4 mmol/L (ref 3.5–5.2)
Sodium: 140 mmol/L (ref 134–144)

## 2020-05-01 ENCOUNTER — Encounter: Payer: Self-pay | Admitting: *Deleted

## 2020-05-02 ENCOUNTER — Telehealth (HOSPITAL_COMMUNITY): Payer: Self-pay | Admitting: Emergency Medicine

## 2020-05-02 NOTE — Telephone Encounter (Signed)
Pt returning phone call regarding upcoming cardiac imaging study; pt verbalizes understanding of appt date/time, parking situation and where to check in, pre-test NPO status and medications ordered, and verified current allergies; name and call back number provided for further questions should they arise Borghild Thaker RN Navigator Cardiac Imaging [facility] [phone] office [phone] cell   

## 2020-05-02 NOTE — Telephone Encounter (Signed)
Attempted to call patient regarding upcoming cardiac CT appointment. °Left message on voicemail with name and callback number °Jayna Mulnix RN Navigator Cardiac Imaging °[facility] °[phone] Office °[phone] Cell ° °

## 2020-05-03 ENCOUNTER — Encounter (HOSPITAL_COMMUNITY): Payer: Self-pay

## 2020-05-03 ENCOUNTER — Ambulatory Visit (HOSPITAL_COMMUNITY)
Admission: RE | Admit: 2020-05-03 | Discharge: 2020-05-03 | Disposition: A | Discharge status: Home / Self Care | Payer: Medicare Other | Source: Ambulatory Visit | Attending: Cardiology | Admitting: Cardiology

## 2020-05-03 ENCOUNTER — Other Ambulatory Visit: Payer: Self-pay

## 2020-05-03 DIAGNOSIS — I517 Cardiomegaly: Secondary | ICD-10-CM | POA: Insufficient documentation

## 2020-05-03 DIAGNOSIS — I10 Essential (primary) hypertension: Secondary | ICD-10-CM

## 2020-05-03 DIAGNOSIS — I700 Atherosclerosis of aorta: Secondary | ICD-10-CM | POA: Diagnosis not present

## 2020-05-03 DIAGNOSIS — I5032 Chronic diastolic (congestive) heart failure: Secondary | ICD-10-CM | POA: Diagnosis not present

## 2020-05-03 DIAGNOSIS — I471 Supraventricular tachycardia, unspecified: Secondary | ICD-10-CM

## 2020-05-03 DIAGNOSIS — R0609 Other forms of dyspnea: Secondary | ICD-10-CM

## 2020-05-03 DIAGNOSIS — Z006 Encounter for examination for normal comparison and control in clinical research program: Secondary | ICD-10-CM

## 2020-05-03 DIAGNOSIS — R0600 Dyspnea, unspecified: Secondary | ICD-10-CM | POA: Insufficient documentation

## 2020-05-03 DIAGNOSIS — R42 Dizziness and giddiness: Secondary | ICD-10-CM | POA: Diagnosis not present

## 2020-05-03 MED ORDER — NITROGLYCERIN 0.4 MG SL SUBL
SUBLINGUAL_TABLET | SUBLINGUAL | Status: AC
  Filled 2020-05-03: qty 2

## 2020-05-03 MED ORDER — IOHEXOL 350 MG/ML SOLN
80.0000 mL | Freq: Once | INTRAVENOUS | Status: AC | PRN
  Administered 2020-05-03: 80 mL via INTRAVENOUS

## 2020-05-03 MED ORDER — NITROGLYCERIN 0.4 MG SL SUBL
0.8000 mg | SUBLINGUAL_TABLET | Freq: Once | SUBLINGUAL | Status: AC
  Administered 2020-05-03: 0.8 mg via SUBLINGUAL

## 2020-05-03 NOTE — Research (Addendum)
IDENTIFY Informed Consent                  Subject Name:   [name] [name]   Subject met inclusion and exclusion criteria.  The informed consent form, study requirements and expectations were reviewed with the subject and questions and concerns were addressed prior to the signing of the consent form.  The subject verbalized understanding of the trial requirements.  The subject agreed to participate in the IDENTIFY trial and signed the informed consent.  The informed consent was obtained prior to performance of any protocol-specific procedures for the subject.  A copy of the signed informed consent was given to the subject and a copy was placed in the subject's medical record.   Meade Maw, Research Assistant 05/03/2020 10:48AM

## 2020-05-19 ENCOUNTER — Ambulatory Visit: Payer: Medicare Other | Admitting: Podiatry

## 2020-05-22 NOTE — Telephone Encounter (Signed)
Patient is requesting to schedule her SVT ablation.

## 2020-05-23 ENCOUNTER — Other Ambulatory Visit: Payer: Self-pay

## 2020-05-23 DIAGNOSIS — I10 Essential (primary) hypertension: Secondary | ICD-10-CM

## 2020-05-23 DIAGNOSIS — E785 Hyperlipidemia, unspecified: Secondary | ICD-10-CM

## 2020-05-23 DIAGNOSIS — M542 Cervicalgia: Secondary | ICD-10-CM | POA: Diagnosis not present

## 2020-05-23 DIAGNOSIS — R0600 Dyspnea, unspecified: Secondary | ICD-10-CM

## 2020-05-23 DIAGNOSIS — G894 Chronic pain syndrome: Secondary | ICD-10-CM | POA: Diagnosis not present

## 2020-05-23 DIAGNOSIS — M5412 Radiculopathy, cervical region: Secondary | ICD-10-CM | POA: Diagnosis not present

## 2020-05-23 DIAGNOSIS — M5136 Other intervertebral disc degeneration, lumbar region: Secondary | ICD-10-CM | POA: Diagnosis not present

## 2020-05-23 DIAGNOSIS — G8929 Other chronic pain: Secondary | ICD-10-CM | POA: Diagnosis not present

## 2020-05-23 DIAGNOSIS — Z79891 Long term (current) use of opiate analgesic: Secondary | ICD-10-CM | POA: Diagnosis not present

## 2020-05-23 DIAGNOSIS — I5032 Chronic diastolic (congestive) heart failure: Secondary | ICD-10-CM

## 2020-05-23 DIAGNOSIS — R0609 Other forms of dyspnea: Secondary | ICD-10-CM

## 2020-05-23 DIAGNOSIS — M545 Low back pain, unspecified: Secondary | ICD-10-CM | POA: Diagnosis not present

## 2020-05-23 MED ORDER — FUROSEMIDE 40 MG PO TABS: 40.0000 mg | ORAL_TABLET | Freq: Every day | ORAL | 3 refills | Status: DC

## 2020-05-23 NOTE — Telephone Encounter (Signed)
Pt would like to go for ablation on 4/1.   Aware I will follow up at later date to go over details/instructions

## 2020-05-23 NOTE — Telephone Encounter (Signed)
Pt's medication was sent to pt's pharmacy as requested. Confirmation received.  °

## 2020-05-31 ENCOUNTER — Telehealth: Payer: Self-pay | Admitting: Cardiology

## 2020-05-31 DIAGNOSIS — I471 Supraventricular tachycardia, unspecified: Secondary | ICD-10-CM

## 2020-05-31 DIAGNOSIS — Z01812 Encounter for preprocedural laboratory examination: Secondary | ICD-10-CM

## 2020-05-31 NOTE — Telephone Encounter (Signed)
Patient wanted to discuss details for upcoming procedure with Dr. Elberta Fortis.  Please call patient

## 2020-05-31 NOTE — Telephone Encounter (Signed)
Left detailed message for patient that Dr. Gershon Crane nurse, Roanna Raider is not in the office today but that I will forward message to her to follow up when she returns.

## 2020-06-01 NOTE — Telephone Encounter (Signed)
Reviewed instructions with pt. Aware I will send via mychart as requested. Covid screening scheduled for 3/30, pt will stop by office prior to this and obtain pre procedure blood work. Aware office will call to arrange post procedure follow up. Patient verbalized understanding and agreeable to plan.

## 2020-06-20 ENCOUNTER — Ambulatory Visit: Payer: Medicare Other | Admitting: Podiatry

## 2020-06-20 DIAGNOSIS — M542 Cervicalgia: Secondary | ICD-10-CM | POA: Diagnosis not present

## 2020-06-20 DIAGNOSIS — G8929 Other chronic pain: Secondary | ICD-10-CM | POA: Diagnosis not present

## 2020-06-20 DIAGNOSIS — M5136 Other intervertebral disc degeneration, lumbar region: Secondary | ICD-10-CM | POA: Diagnosis not present

## 2020-06-20 DIAGNOSIS — M5412 Radiculopathy, cervical region: Secondary | ICD-10-CM | POA: Diagnosis not present

## 2020-06-20 DIAGNOSIS — M545 Low back pain, unspecified: Secondary | ICD-10-CM | POA: Diagnosis not present

## 2020-06-20 DIAGNOSIS — Z79891 Long term (current) use of opiate analgesic: Secondary | ICD-10-CM | POA: Diagnosis not present

## 2020-06-20 DIAGNOSIS — G894 Chronic pain syndrome: Secondary | ICD-10-CM | POA: Diagnosis not present

## 2020-06-23 ENCOUNTER — Ambulatory Visit (INDEPENDENT_AMBULATORY_CARE_PROVIDER_SITE_OTHER): Payer: Medicare Other

## 2020-06-23 ENCOUNTER — Other Ambulatory Visit: Payer: Self-pay

## 2020-06-23 ENCOUNTER — Ambulatory Visit (INDEPENDENT_AMBULATORY_CARE_PROVIDER_SITE_OTHER): Payer: Medicare Other | Admitting: Podiatry

## 2020-06-23 DIAGNOSIS — M2042 Other hammer toe(s) (acquired), left foot: Secondary | ICD-10-CM

## 2020-06-23 DIAGNOSIS — B351 Tinea unguium: Secondary | ICD-10-CM | POA: Diagnosis not present

## 2020-06-23 DIAGNOSIS — M79675 Pain in left toe(s): Secondary | ICD-10-CM

## 2020-06-23 MED ORDER — CICLOPIROX 8 % EX SOLN
Freq: Every day | CUTANEOUS | 0 refills | Status: DC
Start: 2020-06-23 — End: 2021-03-28

## 2020-06-23 MED ORDER — FLUCONAZOLE 150 MG PO TABS
150.0000 mg | ORAL_TABLET | ORAL | 2 refills | Status: DC
Start: 2020-06-23 — End: 2021-03-28

## 2020-06-23 NOTE — Progress Notes (Signed)
  Subjective:  Patient ID: [name] [name], female    DOB: Jul 31, 1960,  MRN: [id number]  Chief Complaint  Patient presents with  . Surgery Consultation    Consult for Left foot hammer toe.   . Nail Problem    Follow up nail fungus. Pt needs new Rx. Pt lost Rx for fungus.     60 y.o. female presents with the above complaint. History confirmed with patient. Has tried OTC medications without relief. Hx >10 years.  Secondary complaint at end of visit - has hx of break to the left 2nd toe, states it is painful and stiff. Would like to discuss options  Objective:  Physical Exam: warm, good capillary refill, nail exam onychomycosis of the toenails left>right, no trophic changes or ulcerative lesions, normal DP and PT pulses and normal sensory exam. Left 2nd toe stiffened across the PIPJ with POP and hammertoe contracture. Assessment:   1. Hammer toe of left foot   2. Onychomycosis   3. Pain in toe of left foot      Plan:  Patient was evaluated and treated and all questions answered.  Onychomycosis -Educated on etiology of nail fungus. -Rx fluconazole weekly and Penlac. Educated on use and r/b. -Nails debrided, courtesy  Hammertoe left 2nd toe with hx of fracture -Plan for surgical correction given persistent pain  -Patient has failed all conservative therapy and wishes to proceed with surgical intervention. All risks, benefits, and alternatives discussed with patient. No guarantees given. Consent reviewed and signed by patient. -Planned procedures: left 2nd toe correction of hammertoe with pin or screw fixation -ASA 2 - Patient with mild systemic disease with no functional limitations -Post-op anticoagulation: chemoprophylaxis not indicated -DME dispensed for post-op use: [facility]     No follow-ups on file.

## 2020-06-27 ENCOUNTER — Telehealth: Payer: Self-pay | Admitting: Cardiology

## 2020-06-27 ENCOUNTER — Telehealth: Payer: Self-pay | Admitting: Urology

## 2020-06-27 NOTE — Telephone Encounter (Addendum)
DOS - 09/13/20   HAMMER TOE REAPIR 2ND LEFT --- [id number]   [employer] EFFECTIVE DATE  - 05/02/20   PLAN DEDUCTIBLE - $0.00 W/ $0.00 REMAINING OUT OF POCKET - $7,550.00  W/  $7,512.18 REMAINING COPAY - $0.00 COINSURANCE - 20%   PER [employer] WEBSITE FOR CPT CODE 62831 Notification or Prior Authorization is not required for the requested services   Decision ID #:[id number]     CALLED [employer] MEDICARE ON 07/11/2020, SPOKE TO JANE REF# [id number]. SHE STATED NO PRIOR AUTH IS REQUIRED FOR CPT CODE 54627.

## 2020-06-27 NOTE — Telephone Encounter (Signed)
Did not need this encounter °

## 2020-06-28 ENCOUNTER — Other Ambulatory Visit: Payer: Medicare Other | Admitting: *Deleted

## 2020-06-28 ENCOUNTER — Other Ambulatory Visit: Payer: Self-pay

## 2020-06-28 ENCOUNTER — Other Ambulatory Visit (HOSPITAL_COMMUNITY)
Admission: RE | Admit: 2020-06-28 | Discharge: 2020-06-28 | Disposition: A | Discharge status: Home / Self Care | Payer: Medicare Other | Source: Ambulatory Visit | Attending: Cardiology | Admitting: Cardiology

## 2020-06-28 DIAGNOSIS — Z01812 Encounter for preprocedural laboratory examination: Secondary | ICD-10-CM | POA: Insufficient documentation

## 2020-06-28 DIAGNOSIS — I471 Supraventricular tachycardia: Secondary | ICD-10-CM

## 2020-06-28 DIAGNOSIS — Z20822 Contact with and (suspected) exposure to covid-19: Secondary | ICD-10-CM | POA: Insufficient documentation

## 2020-06-28 LAB — CBC
Hematocrit: 39.5 % (ref 34.0–46.6)
Hemoglobin: 13.2 g/dL (ref 11.1–15.9)
MCH: 28.3 pg (ref 26.6–33.0)
MCHC: 33.4 g/dL (ref 31.5–35.7)
MCV: 85 fL (ref 79–97)
Platelets: 258 10*3/uL (ref 150–450)
RBC: 4.66 x10E6/uL (ref 3.77–5.28)
RDW: 14.6 % (ref 11.7–15.4)
WBC: 6.7 10*3/uL (ref 3.4–10.8)

## 2020-06-28 LAB — BASIC METABOLIC PANEL
BUN/Creatinine Ratio: 17 (ref 12–28)
BUN: 14 mg/dL (ref 8–27)
CO2: 26 mmol/L (ref 20–29)
Calcium: 9.1 mg/dL (ref 8.7–10.3)
Chloride: 105 mmol/L (ref 96–106)
Creatinine, Ser: 0.83 mg/dL (ref 0.57–1.00)
Glucose: 93 mg/dL (ref 65–99)
Potassium: 3.7 mmol/L (ref 3.5–5.2)
Sodium: 143 mmol/L (ref 134–144)
eGFR: 80 mL/min/{1.73_m2} (ref >59)

## 2020-06-28 LAB — SARS CORONAVIRUS 2 (TAT 6-24 HRS): SARS Coronavirus 2: NEGATIVE

## 2020-06-30 ENCOUNTER — Ambulatory Visit (HOSPITAL_COMMUNITY): Payer: Medicare Other | Admitting: Certified Registered Nurse Anesthetist

## 2020-06-30 ENCOUNTER — Ambulatory Visit (HOSPITAL_COMMUNITY)
Admission: RE | Admit: 2020-06-30 | Discharge: 2020-06-30 | Disposition: A | Discharge status: Home / Self Care | Payer: Medicare Other | Attending: Cardiology | Admitting: Cardiology

## 2020-06-30 ENCOUNTER — Ambulatory Visit (HOSPITAL_COMMUNITY)
Admission: RE | Disposition: A | Discharge status: Home / Self Care | Payer: Medicare Other | Source: Home / Self Care | Attending: Cardiology

## 2020-06-30 ENCOUNTER — Other Ambulatory Visit: Payer: Self-pay

## 2020-06-30 DIAGNOSIS — I110 Hypertensive heart disease with heart failure: Secondary | ICD-10-CM | POA: Diagnosis not present

## 2020-06-30 DIAGNOSIS — Z880 Allergy status to penicillin: Secondary | ICD-10-CM | POA: Diagnosis not present

## 2020-06-30 DIAGNOSIS — Z885 Allergy status to narcotic agent status: Secondary | ICD-10-CM | POA: Diagnosis not present

## 2020-06-30 DIAGNOSIS — Z87891 Personal history of nicotine dependence: Secondary | ICD-10-CM | POA: Diagnosis not present

## 2020-06-30 DIAGNOSIS — I471 Supraventricular tachycardia: Secondary | ICD-10-CM | POA: Insufficient documentation

## 2020-06-30 DIAGNOSIS — E559 Vitamin D deficiency, unspecified: Secondary | ICD-10-CM | POA: Diagnosis not present

## 2020-06-30 DIAGNOSIS — I509 Heart failure, unspecified: Secondary | ICD-10-CM | POA: Diagnosis not present

## 2020-06-30 HISTORY — PX: SVT ABLATION: EP1225

## 2020-06-30 SURGERY — SVT ABLATION
Anesthesia: Monitor Anesthesia Care

## 2020-06-30 MED ORDER — PROPOFOL 10 MG/ML IV BOLUS
INTRAVENOUS | Status: DC | PRN
  Administered 2020-06-30: 10 mg via INTRAVENOUS

## 2020-06-30 MED ORDER — LIDOCAINE HCL (PF) 1 % IJ SOLN
INTRAMUSCULAR | Status: DC | PRN
  Administered 2020-06-30: 60 mL

## 2020-06-30 MED ORDER — ONDANSETRON HCL 4 MG/2ML IJ SOLN: 4.0000 mg | Freq: Four times a day (QID) | INTRAMUSCULAR | Status: DC | PRN

## 2020-06-30 MED ORDER — FENTANYL CITRATE (PF) 100 MCG/2ML IJ SOLN
INTRAMUSCULAR | Status: DC | PRN
  Administered 2020-06-30 (×2): 25 ug via INTRAVENOUS

## 2020-06-30 MED ORDER — ACETAMINOPHEN 325 MG PO TABS
650.0000 mg | ORAL_TABLET | ORAL | Status: DC | PRN
  Filled 2020-06-30: qty 2

## 2020-06-30 MED ORDER — PROPOFOL 500 MG/50ML IV EMUL
INTRAVENOUS | Status: DC | PRN
  Administered 2020-06-30: 50 ug/kg/min via INTRAVENOUS

## 2020-06-30 MED ORDER — FENTANYL CITRATE (PF) 100 MCG/2ML IJ SOLN: 25.0000 ug | INTRAMUSCULAR | Status: DC | PRN

## 2020-06-30 MED ORDER — ONDANSETRON HCL 4 MG/2ML IJ SOLN
INTRAMUSCULAR | Status: DC | PRN
  Administered 2020-06-30: 4 mg via INTRAVENOUS

## 2020-06-30 MED ORDER — SODIUM CHLORIDE 0.9 % IV SOLN: 250.0000 mL | INTRAVENOUS | Status: DC | PRN

## 2020-06-30 MED ORDER — SODIUM CHLORIDE 0.9% FLUSH: 3.0000 mL | Freq: Two times a day (BID) | INTRAVENOUS | Status: DC

## 2020-06-30 MED ORDER — HEPARIN (PORCINE) IN NACL 1000-0.9 UT/500ML-% IV SOLN
INTRAVENOUS | Status: AC
  Filled 2020-06-30: qty 500

## 2020-06-30 MED ORDER — HEPARIN (PORCINE) IN NACL 1000-0.9 UT/500ML-% IV SOLN
INTRAVENOUS | Status: DC | PRN
  Administered 2020-06-30: 500 mL

## 2020-06-30 MED ORDER — SODIUM CHLORIDE 0.9% FLUSH: 3.0000 mL | INTRAVENOUS | Status: DC | PRN

## 2020-06-30 MED ORDER — ONDANSETRON HCL 4 MG/2ML IJ SOLN: 4.0000 mg | Freq: Once | INTRAMUSCULAR | Status: DC | PRN

## 2020-06-30 MED ORDER — SODIUM CHLORIDE 0.9 % IV SOLN: INTRAVENOUS | Status: DC

## 2020-06-30 MED ORDER — LIDOCAINE HCL 1 % IJ SOLN
INTRAMUSCULAR | Status: AC
  Filled 2020-06-30: qty 40

## 2020-06-30 MED ORDER — MIDAZOLAM HCL 5 MG/5ML IJ SOLN
INTRAMUSCULAR | Status: DC | PRN
  Administered 2020-06-30: 1 mg via INTRAVENOUS
  Administered 2020-06-30 (×2): .5 mg via INTRAVENOUS

## 2020-06-30 SURGICAL SUPPLY — 14 items
BAG SNAP BAND KOVER 36X36 (MISCELLANEOUS) ×2 IMPLANT
BLANKET WARM UNDERBOD FULL ACC (MISCELLANEOUS) ×2 IMPLANT
CATH EZ STEER NAV 4MM D-F CUR (ABLATOR) ×2 IMPLANT
CATH JOSEPH QUAD ALLRED 6F REP (CATHETERS) ×4 IMPLANT
CATH WEB BI DIR CSDF CRV REPRO (CATHETERS) ×2 IMPLANT
CLOSURE PERCLOSE PROSTYLE (VASCULAR PRODUCTS) ×8 IMPLANT
MAT PREVALON FULL STRYKER (MISCELLANEOUS) ×2 IMPLANT
PACK EP LATEX FREE (CUSTOM PROCEDURE TRAY) ×1
PACK EP LF (CUSTOM PROCEDURE TRAY) ×1 IMPLANT
PAD PRO RADIOLUCENT 2001M-C (PAD) ×2 IMPLANT
SHEATH PINNACLE 6F 10CM (SHEATH) ×4 IMPLANT
SHEATH PINNACLE 7F 10CM (SHEATH) ×2 IMPLANT
SHEATH PINNACLE 8F 10CM (SHEATH) ×2 IMPLANT
SHEATH PROBE COVER 6X72 (BAG) ×2 IMPLANT

## 2020-06-30 NOTE — Discharge Instructions (Signed)
Cardiac Ablation, Care After  This sheet gives you information about how to care for yourself after your procedure. Your health care provider may also give you more specific instructions. If you have problems or questions, contact your health care provider. What can I expect after the procedure? After the procedure, it is common to have:  Bruising around your puncture site.  Tenderness around your puncture site.  Skipped heartbeats.  Tiredness (fatigue).  Follow these instructions at home: Puncture site care   Follow instructions from your health care provider about how to take care of your puncture site. Make sure you: ? If present, leave stitches (sutures), skin glue, or adhesive strips in place. These skin closures may need to stay in place for up to 2 weeks. If adhesive strip edges start to loosen and curl up, you may trim the loose edges. Do not remove adhesive strips completely unless your health care provider tells you to do that. ? If a large square bandage is present, this may be removed 24 hours after surgery.   Check your puncture site every day for signs of infection. Check for: ? Redness, swelling, or pain. ? Fluid or blood. If your puncture site starts to bleed, lie down on your back, apply firm pressure to the area, and contact your health care provider. ? Warmth. ? Pus or a bad smell. Driving  Do not drive for at least 4 days after your procedure or however long your health care provider recommends. (Do not resume driving if you have previously been instructed not to drive for other health reasons.)  Do not drive or use heavy machinery while taking prescription pain medicine. Activity  Avoid activities that take a lot of effort for at least 7 days after your procedure.  Do not lift anything that is heavier than 5 lb (4.5 kg) for one week.   No sexual activity for 1 week.   Return to your normal activities as told by your health care provider. Ask your health  care provider what activities are safe for you. General instructions  Take over-the-counter and prescription medicines only as told by your health care provider.  Do not use any products that contain nicotine or tobacco, such as cigarettes and e-cigarettes. If you need help quitting, ask your health care provider.  You may shower after 24 hours, but Do not take baths, swim, or use a hot tub for 1 week.   Do not drink alcohol for 24 hours after your procedure.  Keep all follow-up visits as told by your health care provider. This is important. Contact a health care provider if:  You have redness, mild swelling, or pain around your puncture site.  You have fluid or blood coming from your puncture site that stops after applying firm pressure to the area.  Your puncture site feels warm to the touch.  You have pus or a bad smell coming from your puncture site.  You have a fever.  You have chest pain or discomfort that spreads to your neck, jaw, or arm.  You are sweating a lot.  You feel nauseous.  You have a fast or irregular heartbeat.  You have shortness of breath.  You are dizzy or light-headed and feel the need to lie down.  You have pain or numbness in the arm or leg closest to your puncture site. Get help right away if:  Your puncture site suddenly swells.  Your puncture site is bleeding and the bleeding does not stop after   applying firm pressure to the area. These symptoms may represent a serious problem that is an emergency. Do not wait to see if the symptoms will go away. Get medical help right away. Call your local emergency services (911 in the [place] Do not drive yourself to the hospital. Summary  After the procedure, it is normal to have bruising and tenderness at the puncture site in your groin, neck, or forearm.  Check your puncture site every day for signs of infection.  Get help right away if your puncture site is bleeding and the bleeding does not stop  after applying firm pressure to the area. This is a medical emergency. This information is not intended to replace advice given to you by your health care provider. Make sure you discuss any questions you have with your health care provider.  Post procedure care instructions No driving for 4 days. No lifting over 5 lbs for 1 week. No vigorous or sexual activity for 1 week. You may return to work/your usual activities on 07/07/20. Keep procedure site clean & dry. If you notice increased pain, swelling, bleeding or pus, call/return!  You may shower after 24 hours, but no soaking in baths/hot tubs/pools for 1 week.

## 2020-06-30 NOTE — Progress Notes (Signed)
Patient insisting on getting up to go to the bathroom. Explained bleeding precautions and bedrest x 3 hours. Patient state "Im going to the bathroom anyway. Convinced patient to allow me to place a purewirk so that she may void. Patient spoke with daughter via telephone who so encouraged her to remain in the bed. Will continue to monitor.[name] [name]

## 2020-06-30 NOTE — Transfer of Care (Signed)
Immediate Anesthesia Transfer of Care Note  Patient: [name] [name]  Procedure(s) Performed: SVT ABLATION (N/A )  Patient Location: Cath Lab  Anesthesia Type:MAC  Level of Consciousness: drowsy and patient cooperative  Airway & Oxygen Therapy: Patient Spontanous Breathing and Patient connected to nasal cannula oxygen  Post-op Assessment: Report given to RN and Post -op Vital signs reviewed and stable  Post vital signs: Reviewed  Last Vitals:  Vitals Value Taken Time  BP 168/100 06/30/20 1202  Temp 36.3 C 06/30/20 1201  Pulse 59 06/30/20 1205  Resp 14 06/30/20 1205  SpO2 100 % 06/30/20 1205  Vitals shown include unvalidated device data.  Last Pain:  Vitals:   06/30/20 1201  TempSrc: Temporal  PainSc: 0-No pain         Complications: No complications documented.

## 2020-06-30 NOTE — Progress Notes (Signed)
Discharge instructions reviewed with pt and her husband (via telephone) both voice understanding.  

## 2020-06-30 NOTE — Progress Notes (Addendum)
Ambulated to the bathroom to void tol well. No bleeding noted before or after ambulation.

## 2020-06-30 NOTE — Anesthesia Procedure Notes (Signed)
Procedure Name: MAC Date/Time: 06/30/2020 10:21 AM Performed by: Janene Harvey, CRNA Pre-anesthesia Checklist: Patient identified, Emergency Drugs available, Suction available and Patient being monitored Patient Re-evaluated:Patient Re-evaluated prior to induction Oxygen Delivery Method: Nasal cannula Placement Confirmation: positive ETCO2 Dental Injury: Teeth and Oropharynx as per pre-operative assessment
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
## 2020-06-30 NOTE — Progress Notes (Signed)
Patient is fully awake and following commands. No neuro deficits. Denies any chest pains or shortness of breath. Purewick place to relieve bladder. No distress at this time. Will transport to short stay.[name] [name]

## 2020-06-30 NOTE — Anesthesia Preprocedure Evaluation (Signed)
Anesthesia Evaluation  Patient identified by MRN, date of birth, ID band Patient awake    Reviewed: Allergy & Precautions, NPO status , Patient's Chart, lab work & pertinent test results  Airway Mallampati: II  TM Distance: >3 FB Neck ROM: Full    Dental  (+) Dental Advisory Given, Partial Upper   Pulmonary former smoker,    breath sounds clear to auscultation       Cardiovascular  Rhythm:Regular Rate:Normal     Neuro/Psych    GI/Hepatic   Endo/Other  diabetes  Renal/GU      Musculoskeletal   Abdominal (+) + obese,   Peds  Hematology   Anesthesia Other Findings   Reproductive/Obstetrics                             Anesthesia Physical Anesthesia Plan  ASA: III  Anesthesia Plan: MAC   Post-op Pain Management:    Induction: Intravenous  PONV Risk Score and Plan: Ondansetron and Dexamethasone  Airway Management Planned: Natural Airway and Simple Face Mask  Additional Equipment:   Intra-op Plan:   Post-operative Plan:   Informed Consent: I have reviewed the patients History and Physical, chart, labs and discussed the procedure including the risks, benefits and alternatives for the proposed anesthesia with the patient or authorized representative who has indicated his/her understanding and acceptance.     Dental advisory given  Plan Discussed with: CRNA and Anesthesiologist  Anesthesia Plan Comments:         Anesthesia Quick Evaluation

## 2020-07-02 MED FILL — Lidocaine HCl Local Inj 1%: INTRAMUSCULAR | Qty: 60 | Status: CN

## 2020-07-02 MED FILL — Lidocaine HCl Local Preservative Free (PF) Inj 1%: INTRAMUSCULAR | Qty: 60 | Status: AC

## 2020-07-03 ENCOUNTER — Encounter (HOSPITAL_COMMUNITY): Payer: Self-pay | Admitting: Cardiology

## 2020-07-03 ENCOUNTER — Telehealth: Payer: Self-pay

## 2020-07-03 NOTE — Telephone Encounter (Signed)
[name] [name] called to reschedule her surgery on 07/05/2020 with Dr. Samuella Cota. Surgery was reschedled to 08/02/2020. [name] [name] had an procedure done on 06/30/2020 and doesn't feel up to having surgery on 07/05/2020. Notified Dr. Samuella Cota and [name] with [facility].

## 2020-07-04 NOTE — Anesthesia Postprocedure Evaluation (Signed)
Anesthesia Post Note  Patient: [name] [name]  Procedure(s) Performed: SVT ABLATION (N/A )     Patient location during evaluation: PACU Anesthesia Type: MAC Level of consciousness: awake and alert Pain management: pain level controlled Vital Signs Assessment: post-procedure vital signs reviewed and stable Respiratory status: spontaneous breathing, nonlabored ventilation, respiratory function stable and patient connected to nasal cannula oxygen Cardiovascular status: stable and blood pressure returned to baseline Postop Assessment: no apparent nausea or vomiting Anesthetic complications: no   No complications documented.  Last Vitals:  Vitals:   06/30/20 1400 06/30/20 1415  BP: (!) 155/96   Pulse: 93 91  Resp: 20 (!) 23  Temp:    SpO2: 99% 99%    Last Pain:  Vitals:   06/30/20 1308  TempSrc:   PainSc: 0-No pain                 Ademola Vert COKER

## 2020-07-11 ENCOUNTER — Encounter: Payer: Medicare Other | Admitting: Podiatry

## 2020-07-18 DIAGNOSIS — G894 Chronic pain syndrome: Secondary | ICD-10-CM | POA: Diagnosis not present

## 2020-07-18 DIAGNOSIS — M542 Cervicalgia: Secondary | ICD-10-CM | POA: Diagnosis not present

## 2020-07-18 DIAGNOSIS — Z79891 Long term (current) use of opiate analgesic: Secondary | ICD-10-CM | POA: Diagnosis not present

## 2020-07-18 DIAGNOSIS — G8929 Other chronic pain: Secondary | ICD-10-CM | POA: Diagnosis not present

## 2020-07-18 DIAGNOSIS — M5412 Radiculopathy, cervical region: Secondary | ICD-10-CM | POA: Diagnosis not present

## 2020-07-18 DIAGNOSIS — M545 Low back pain, unspecified: Secondary | ICD-10-CM | POA: Diagnosis not present

## 2020-07-18 DIAGNOSIS — M5136 Other intervertebral disc degeneration, lumbar region: Secondary | ICD-10-CM | POA: Diagnosis not present

## 2020-07-19 ENCOUNTER — Other Ambulatory Visit: Payer: Self-pay | Admitting: Cardiology

## 2020-07-19 DIAGNOSIS — I471 Supraventricular tachycardia: Secondary | ICD-10-CM

## 2020-07-19 DIAGNOSIS — R42 Dizziness and giddiness: Secondary | ICD-10-CM

## 2020-07-19 DIAGNOSIS — G894 Chronic pain syndrome: Secondary | ICD-10-CM | POA: Diagnosis not present

## 2020-07-19 DIAGNOSIS — I10 Essential (primary) hypertension: Secondary | ICD-10-CM

## 2020-07-25 ENCOUNTER — Encounter: Payer: Medicare Other | Admitting: Podiatry

## 2020-07-27 ENCOUNTER — Telehealth: Payer: Self-pay

## 2020-07-27 NOTE — Telephone Encounter (Signed)
[name] [name] called to cancel her surgery with Dr. Samuella Cota on 08/02/2020. She stated she is having some heart issues and has to get that checked out. She will call back to reschedule. Notified Dr. Samuella Cota and [name] with [facility]

## 2020-08-01 ENCOUNTER — Encounter: Payer: Self-pay | Admitting: Cardiology

## 2020-08-01 ENCOUNTER — Other Ambulatory Visit: Payer: Self-pay

## 2020-08-01 ENCOUNTER — Ambulatory Visit (INDEPENDENT_AMBULATORY_CARE_PROVIDER_SITE_OTHER): Payer: Medicare Other | Admitting: Cardiology

## 2020-08-01 VITALS — BP 130/78 | HR 63 | Ht 65.0 in | Wt 217.8 lb

## 2020-08-01 DIAGNOSIS — I471 Supraventricular tachycardia: Secondary | ICD-10-CM | POA: Diagnosis not present
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
## 2020-08-03 ENCOUNTER — Ambulatory Visit: Payer: Medicare Other | Admitting: Cardiology

## 2020-08-08 ENCOUNTER — Encounter: Payer: Medicare Other | Admitting: Podiatry

## 2020-08-15 ENCOUNTER — Telehealth: Payer: Self-pay

## 2020-08-15 DIAGNOSIS — G894 Chronic pain syndrome: Secondary | ICD-10-CM | POA: Diagnosis not present

## 2020-08-15 DIAGNOSIS — M5412 Radiculopathy, cervical region: Secondary | ICD-10-CM | POA: Diagnosis not present

## 2020-08-15 DIAGNOSIS — I471 Supraventricular tachycardia: Secondary | ICD-10-CM | POA: Diagnosis not present

## 2020-08-15 DIAGNOSIS — Z79891 Long term (current) use of opiate analgesic: Secondary | ICD-10-CM | POA: Diagnosis not present

## 2020-08-15 DIAGNOSIS — M542 Cervicalgia: Secondary | ICD-10-CM | POA: Diagnosis not present

## 2020-08-15 DIAGNOSIS — Z006 Encounter for examination for normal comparison and control in clinical research program: Secondary | ICD-10-CM

## 2020-08-15 DIAGNOSIS — G8929 Other chronic pain: Secondary | ICD-10-CM | POA: Diagnosis not present

## 2020-08-15 DIAGNOSIS — R0689 Other abnormalities of breathing: Secondary | ICD-10-CM | POA: Diagnosis not present

## 2020-08-15 DIAGNOSIS — I10 Essential (primary) hypertension: Secondary | ICD-10-CM | POA: Diagnosis not present

## 2020-08-15 DIAGNOSIS — M47816 Spondylosis without myelopathy or radiculopathy, lumbar region: Secondary | ICD-10-CM | POA: Diagnosis not present

## 2020-08-15 DIAGNOSIS — M5136 Other intervertebral disc degeneration, lumbar region: Secondary | ICD-10-CM | POA: Diagnosis not present

## 2020-08-15 DIAGNOSIS — R0600 Dyspnea, unspecified: Secondary | ICD-10-CM | POA: Diagnosis not present

## 2020-08-15 DIAGNOSIS — M545 Low back pain, unspecified: Secondary | ICD-10-CM | POA: Diagnosis not present

## 2020-08-15 NOTE — Telephone Encounter (Signed)
Called patient for 90 day Identify phone call pt stated she is not having anymore cardiac symptoms but did follow up with PCP, I reminded the patient that we would be calling her back around February for a year follow up phone call.  

## 2020-08-16 ENCOUNTER — Other Ambulatory Visit: Payer: Self-pay | Admitting: Internal Medicine

## 2020-08-16 ENCOUNTER — Ambulatory Visit
Admission: RE | Admit: 2020-08-16 | Discharge: 2020-08-16 | Disposition: A | Discharge status: Home / Self Care | Payer: Medicare Other | Source: Ambulatory Visit | Attending: Internal Medicine | Admitting: Internal Medicine

## 2020-08-16 DIAGNOSIS — J1282 Pneumonia due to coronavirus disease 2019: Secondary | ICD-10-CM

## 2020-08-16 DIAGNOSIS — R0602 Shortness of breath: Secondary | ICD-10-CM | POA: Diagnosis not present
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
## 2020-08-22 ENCOUNTER — Encounter: Payer: Medicare Other | Admitting: Podiatry

## 2020-08-23 ENCOUNTER — Encounter: Payer: Self-pay | Admitting: Cardiology

## 2020-08-23 ENCOUNTER — Ambulatory Visit (INDEPENDENT_AMBULATORY_CARE_PROVIDER_SITE_OTHER): Payer: Medicare Other | Admitting: Cardiology

## 2020-08-23 ENCOUNTER — Other Ambulatory Visit: Payer: Self-pay

## 2020-08-23 ENCOUNTER — Telehealth: Payer: Self-pay | Admitting: *Deleted

## 2020-08-23 VITALS — BP 124/72 | HR 68 | Ht 65.0 in | Wt 217.8 lb

## 2020-08-23 DIAGNOSIS — I5032 Chronic diastolic (congestive) heart failure: Secondary | ICD-10-CM | POA: Diagnosis not present

## 2020-08-23 DIAGNOSIS — I471 Supraventricular tachycardia: Secondary | ICD-10-CM | POA: Diagnosis not present

## 2020-08-23 DIAGNOSIS — R0609 Other forms of dyspnea: Secondary | ICD-10-CM

## 2020-08-23 DIAGNOSIS — R0600 Dyspnea, unspecified: Secondary | ICD-10-CM | POA: Diagnosis not present

## 2020-08-23 DIAGNOSIS — R42 Dizziness and giddiness: Secondary | ICD-10-CM

## 2020-08-23 DIAGNOSIS — E785 Hyperlipidemia, unspecified: Secondary | ICD-10-CM | POA: Diagnosis not present

## 2020-08-23 DIAGNOSIS — G4733 Obstructive sleep apnea (adult) (pediatric): Secondary | ICD-10-CM

## 2020-08-23 DIAGNOSIS — I10 Essential (primary) hypertension: Secondary | ICD-10-CM | POA: Diagnosis not present

## 2020-08-23 MED ORDER — FUROSEMIDE 40 MG PO TABS: 40.0000 mg | ORAL_TABLET | Freq: Every day | ORAL | 2 refills | Status: DC

## 2020-08-23 MED ORDER — METOPROLOL SUCCINATE ER 50 MG PO TB24: 50.0000 mg | ORAL_TABLET | Freq: Two times a day (BID) | ORAL | 2 refills | Status: DC

## 2020-08-23 NOTE — Patient Instructions (Signed)
Medication Instructions:   Your physician recommends that you continue on your current medications as directed. Please refer to the Current Medication list given to you today.  *If you need a refill on your cardiac medications before your next appointment, please call your pharmacy*  Testing/Procedures:  Your physician has recommended that you have a sleep study. This test records several body functions during sleep, including: brain activity, eye movement, oxygen and carbon dioxide blood levels, heart rate and rhythm, breathing rate and rhythm, the flow of air through your mouth and nose, snoring, body muscle movements, and chest and belly movement.  OUR SLEEP COORDINATOR NINA JONES WILL BE IN CONTACT WITH YOU TO ARRANGE THIS TEST   Follow-Up: At [facility], you and your health needs are our priority.  As part of our continuing mission to provide you with exceptional heart care, we have created designated Provider Care Teams.  These Care Teams include your primary Cardiologist (physician) and Advanced Practice Providers (APPs -  Physician Assistants and Nurse Practitioners) who all work together to provide you with the care you need, when you need it.  We recommend signing up for the patient portal called "MyChart".  Sign up information is provided on this After Visit Summary.  MyChart is used to connect with patients for Virtual Visits (Telemedicine).  Patients are able to view lab/test results, encounter notes, upcoming appointments, etc.  Non-urgent messages can be sent to your provider as well.   To learn more about what you can do with MyChart, go to [email or web address].    Your next appointment:   6-8 month(s)  The format for your next appointment:   In Person  Provider:   You will see one of the following Advanced Practice Providers on your designated Care Team:    Tereso Newcomer, PA-C  Vin Bhagat, PA-C  Nada Boozer NP  DAYNA DUNN PA-C  [facility] PA-C
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
## 2020-08-23 NOTE — Telephone Encounter (Signed)
-----   Message from Loa Socks, LPN sent at 2/70/7867 11:35 AM EDT ----- Regarding: SPLIT NIGHT SLEEP STUDY ONLY PER [place] Dr. Shari Prows wants this pt to have a split night sleep study for OSA.  Pt wants in lab if you can please arrange this.  She is inpatient and wanted to get out of the office in a quick hurry.  Order for study in system.  Please schedule this and let me know the date?  She is aware you will be working on this and calling her to arrange.  Thanks [employer]

## 2020-09-06 ENCOUNTER — Encounter: Payer: Self-pay | Admitting: Podiatry

## 2020-09-07 ENCOUNTER — Telehealth: Payer: Self-pay | Admitting: *Deleted

## 2020-09-07 NOTE — Telephone Encounter (Signed)
Per [facility] web portal no PA required for sleep study #[id number]

## 2020-09-07 NOTE — Telephone Encounter (Signed)
-----   Message from Gaynelle Cage, CMA sent at 08/25/2020 10:40 AM EDT ----- Regarding: RE: SPLIT NIGHT SLEEP STUDY ONLY PER [place] This is a [employer] patient. ----- Message ----- From: Loa Socks, LPN Sent: 1/94/1740  11:37 AM EDT To: Gaynelle Cage, CMA, Reesa Chew, CMA, # Subject: SPLIT NIGHT SLEEP STUDY ONLY PER [place]     Dr. Shari Prows wants this pt to have a split night sleep study for OSA.  Pt wants in lab if you can please arrange this.  She is inpatient and wanted to get out of the office in a quick hurry.  Order for study in system.  Please schedule this and let me know the date?  She is aware you will be working on this and calling her to arrange.  Thanks [employer]

## 2020-09-12 ENCOUNTER — Encounter: Payer: Medicare Other | Admitting: Podiatry

## 2020-09-15 DIAGNOSIS — G8929 Other chronic pain: Secondary | ICD-10-CM | POA: Diagnosis not present

## 2020-09-15 DIAGNOSIS — M542 Cervicalgia: Secondary | ICD-10-CM | POA: Diagnosis not present

## 2020-09-15 DIAGNOSIS — M5412 Radiculopathy, cervical region: Secondary | ICD-10-CM | POA: Diagnosis not present

## 2020-09-15 DIAGNOSIS — Z79891 Long term (current) use of opiate analgesic: Secondary | ICD-10-CM | POA: Diagnosis not present

## 2020-09-15 DIAGNOSIS — M5136 Other intervertebral disc degeneration, lumbar region: Secondary | ICD-10-CM | POA: Diagnosis not present

## 2020-09-15 DIAGNOSIS — M545 Low back pain, unspecified: Secondary | ICD-10-CM | POA: Diagnosis not present

## 2020-09-15 DIAGNOSIS — G894 Chronic pain syndrome: Secondary | ICD-10-CM | POA: Diagnosis not present

## 2020-09-19 ENCOUNTER — Encounter: Payer: Medicare Other | Admitting: Podiatry

## 2020-09-19 NOTE — Telephone Encounter (Signed)
Notification or Prior Authorization is not required for the requested services  Decision ID #:[id number] Ok to schedule

## 2020-09-20 ENCOUNTER — Other Ambulatory Visit: Payer: Self-pay | Admitting: Podiatry

## 2020-09-20 DIAGNOSIS — M2042 Other hammer toe(s) (acquired), left foot: Secondary | ICD-10-CM | POA: Diagnosis not present

## 2020-09-20 MED ORDER — ONDANSETRON HCL 4 MG PO TABS: 4.0000 mg | ORAL_TABLET | Freq: Three times a day (TID) | ORAL | 0 refills | Status: DC | PRN

## 2020-09-20 MED ORDER — OXYCODONE-ACETAMINOPHEN 10-325 MG PO TABS: 1.0000 | ORAL_TABLET | ORAL | 0 refills | Status: DC | PRN

## 2020-09-20 MED ORDER — CLINDAMYCIN HCL 150 MG PO CAPS: 300.0000 mg | ORAL_CAPSULE | Freq: Three times a day (TID) | ORAL | 0 refills | Status: DC

## 2020-09-21 NOTE — Telephone Encounter (Signed)
Spoke with patient to inform her of her scheduled sleep study apt in [place] on 11/27/20, patient verified understanding and thanked me for calling, I gave her the number to the office she will be going to should she have any questions.

## 2020-09-26 ENCOUNTER — Encounter: Payer: Medicare Other | Admitting: Podiatry

## 2020-09-26 ENCOUNTER — Other Ambulatory Visit: Payer: Self-pay

## 2020-09-26 ENCOUNTER — Ambulatory Visit (INDEPENDENT_AMBULATORY_CARE_PROVIDER_SITE_OTHER): Payer: Medicare Other

## 2020-09-26 ENCOUNTER — Ambulatory Visit (INDEPENDENT_AMBULATORY_CARE_PROVIDER_SITE_OTHER): Payer: Medicare Other | Admitting: Podiatry

## 2020-09-26 DIAGNOSIS — Z9889 Other specified postprocedural states: Secondary | ICD-10-CM

## 2020-09-26 NOTE — Progress Notes (Signed)
  Subjective:  Patient ID: [name] [name], female    DOB: 1960/04/09,  MRN: [id number]  Chief Complaint  Patient presents with   Routine Post Op    POV #1 DOS 09/20/2020 LT 2ND HAMMERTOE CORRECTION Denies f/c/n/v. Reports manageable pain lvls.     DOS: 09/20/20 Procedure: Hammertoe Correction 2nd toe left  60 y.o. female presents with the above complaint. History confirmed with patient.   Objective:  Physical Exam: tenderness at the surgical site, local edema noted, and calf supple, nontender. Incision: healing well, no significant drainage, no dehiscence  No images are attached to the encounter.  Radiographs: X-ray of the left foot: consistent with post-op state good alignment noted.   Assessment:   1. Status post surgery     Plan:  Patient was evaluated and treated and all questions answered.  Post-operative State -XR reviewed with patient -Dressing applied consisting of betadine, sterile gauze, kerlix, and ACE bandage -WBAT in Surgical shoe -XRs needed at follow-up: none -F/u in 1 week for suture removal   Return in about 1 week (around 10/03/2020) for Post-Op (No XRs).

## 2020-10-03 ENCOUNTER — Encounter: Payer: Medicare Other | Admitting: Podiatry

## 2020-10-04 ENCOUNTER — Telehealth: Payer: Self-pay | Admitting: Podiatry

## 2020-10-04 NOTE — Telephone Encounter (Signed)
Patient called the office stating her bandages are coming off . Pt states she is concern about the bandage around  her left foot. I told her she can come into anytime to get her foot re-wrap

## 2020-10-10 ENCOUNTER — Ambulatory Visit (INDEPENDENT_AMBULATORY_CARE_PROVIDER_SITE_OTHER): Payer: Medicare Other

## 2020-10-10 ENCOUNTER — Ambulatory Visit (INDEPENDENT_AMBULATORY_CARE_PROVIDER_SITE_OTHER): Payer: Medicare Other | Admitting: Podiatry

## 2020-10-10 ENCOUNTER — Other Ambulatory Visit: Payer: Self-pay

## 2020-10-10 DIAGNOSIS — Z9889 Other specified postprocedural states: Secondary | ICD-10-CM | POA: Diagnosis not present

## 2020-10-10 NOTE — Progress Notes (Signed)
  Subjective:  Patient ID: [name] [name], female    DOB: [birth year],  MRN: [id number]  Chief Complaint  Patient presents with   Routine Post Op    Pov - pt states there has been no pain or swelling    DOS: 09/20/20 Procedure: Hammertoe Correction 2nd toe left  60 y.o. female presents with the above complaint. History confirmed with patient.   Objective:  Physical Exam: tenderness at the surgical site, local edema noted, and calf supple, nontender. Incision: healing well, no significant drainage, no dehiscence. Pin appears to have backed out slightly.  No images are attached to the encounter.  Radiographs: X-ray of the left foot: interval backing out of the pin with minimal amount of pin crossing the joint. Toe rectus, good positioning noted.  Assessment:   1. Status post surgery     Plan:  Patient was evaluated and treated and all questions answered.  Post-operative State -Pin does appear to have backed out since previous visit. XR ordered and reviewed. Pin pulled. Betadine splint applied and dry dressing. Leave intact x 1 week. -Sutures removed -WBAT in Surgical shoe     No follow-ups on file.

## 2020-10-13 DIAGNOSIS — M545 Low back pain, unspecified: Secondary | ICD-10-CM | POA: Diagnosis not present

## 2020-10-13 DIAGNOSIS — Z79891 Long term (current) use of opiate analgesic: Secondary | ICD-10-CM | POA: Diagnosis not present

## 2020-10-13 DIAGNOSIS — G8929 Other chronic pain: Secondary | ICD-10-CM | POA: Diagnosis not present

## 2020-10-13 DIAGNOSIS — M5136 Other intervertebral disc degeneration, lumbar region: Secondary | ICD-10-CM | POA: Diagnosis not present

## 2020-10-13 DIAGNOSIS — G894 Chronic pain syndrome: Secondary | ICD-10-CM | POA: Diagnosis not present

## 2020-10-13 DIAGNOSIS — M5412 Radiculopathy, cervical region: Secondary | ICD-10-CM | POA: Diagnosis not present

## 2020-10-13 DIAGNOSIS — M542 Cervicalgia: Secondary | ICD-10-CM | POA: Diagnosis not present

## 2020-10-24 ENCOUNTER — Ambulatory Visit (INDEPENDENT_AMBULATORY_CARE_PROVIDER_SITE_OTHER): Payer: Medicare Other

## 2020-10-24 ENCOUNTER — Ambulatory Visit (INDEPENDENT_AMBULATORY_CARE_PROVIDER_SITE_OTHER): Payer: Medicare Other | Admitting: Podiatry

## 2020-10-24 ENCOUNTER — Other Ambulatory Visit: Payer: Self-pay

## 2020-10-24 DIAGNOSIS — Z9889 Other specified postprocedural states: Secondary | ICD-10-CM

## 2020-10-24 NOTE — Progress Notes (Signed)
  Subjective:  Patient ID: [name] [name], female    DOB: 1960/11/26,  MRN: [id number]  Chief Complaint  Patient presents with   Routine Post Op    POV #3 DOS 09/20/2020 LT 2ND HAMMERTOE CORRECTION. Pt complains of sore between the 1st and 2nd left toe. No other questions or concerns. Suture may need to be removed.    DOS: 09/20/20 Procedure: Hammertoe Correction 2nd toe left  60 y.o. female presents with the above complaint. History confirmed with patient.   Objective:  Physical Exam: tenderness at the surgical site, local edema noted, and calf supple, nontender. Incision: healed  No images are attached to the encounter.  Radiographs: X-ray of the left foot: consistent with post-op state toe rectus , good alignment noted. Assessment:   1. Status post surgery     Plan:  Patient was evaluated and treated and all questions answered.  Post-operative State -Small retained suture removed. -XR taken and show interval healing of the toe. -Continue surgical shoe for 2 weeks, then slowly transition out of surgical shoe by 1-2 hours the first day and doubling every day thereafter.   Return in about 3 weeks (around 11/14/2020) for Post-Op (with XRs).

## 2020-11-14 ENCOUNTER — Encounter: Payer: Medicare Other | Admitting: Podiatry

## 2020-11-17 ENCOUNTER — Other Ambulatory Visit: Payer: Self-pay | Admitting: Podiatry

## 2020-11-17 DIAGNOSIS — M542 Cervicalgia: Secondary | ICD-10-CM | POA: Diagnosis not present

## 2020-11-17 DIAGNOSIS — G894 Chronic pain syndrome: Secondary | ICD-10-CM | POA: Diagnosis not present

## 2020-11-17 DIAGNOSIS — M5136 Other intervertebral disc degeneration, lumbar region: Secondary | ICD-10-CM | POA: Diagnosis not present

## 2020-11-17 DIAGNOSIS — M5412 Radiculopathy, cervical region: Secondary | ICD-10-CM | POA: Diagnosis not present

## 2020-11-17 DIAGNOSIS — M545 Low back pain, unspecified: Secondary | ICD-10-CM | POA: Diagnosis not present

## 2020-11-17 DIAGNOSIS — Z79891 Long term (current) use of opiate analgesic: Secondary | ICD-10-CM | POA: Diagnosis not present

## 2020-11-17 DIAGNOSIS — G8929 Other chronic pain: Secondary | ICD-10-CM | POA: Diagnosis not present

## 2020-11-17 NOTE — Telephone Encounter (Signed)
Please Advise

## 2020-11-21 ENCOUNTER — Ambulatory Visit (INDEPENDENT_AMBULATORY_CARE_PROVIDER_SITE_OTHER): Payer: Medicare Other | Admitting: Podiatry

## 2020-11-21 ENCOUNTER — Other Ambulatory Visit: Payer: Self-pay

## 2020-11-21 ENCOUNTER — Ambulatory Visit (INDEPENDENT_AMBULATORY_CARE_PROVIDER_SITE_OTHER): Payer: Medicare Other

## 2020-11-21 DIAGNOSIS — Z9889 Other specified postprocedural states: Secondary | ICD-10-CM

## 2020-11-21 DIAGNOSIS — M2042 Other hammer toe(s) (acquired), left foot: Secondary | ICD-10-CM

## 2020-11-21 DIAGNOSIS — M79675 Pain in left toe(s): Secondary | ICD-10-CM

## 2020-11-21 NOTE — Progress Notes (Signed)
  Subjective:  Patient ID: [name] [name], female    DOB: 1961-02-09,  MRN: [id number]  Chief Complaint  Patient presents with   Routine Post Op    PT stated that she is concerned about the swelling and discoloration    DOS: 09/20/20 Procedure: Hammertoe Correction 2nd toe left  60 y.o. female presents with the above complaint. History confirmed with patient.   Objective:  Physical Exam: tenderness at the surgical site, local edema noted, and calf supple, nontender. Incision: healed   Radiographs: taken and reviewed, good digital alignment noted. No full bridging across the PIPJ Assessment:   1. Status post surgery   2. Hammer toe of left foot   3. Pain in toe of left foot    Plan:  Patient was evaluated and treated and all questions answered.  Post-operative State -XR taken and reviewed -Continue WBAT in surgical shoe -Will f/u in 1 month for nail fungus discussion per patient request   Return in about 1 month (around 12/22/2020).

## 2020-11-22 DIAGNOSIS — M7051 Other bursitis of knee, right knee: Secondary | ICD-10-CM | POA: Diagnosis not present

## 2020-11-22 DIAGNOSIS — M25561 Pain in right knee: Secondary | ICD-10-CM | POA: Diagnosis not present

## 2020-11-22 DIAGNOSIS — I10 Essential (primary) hypertension: Secondary | ICD-10-CM | POA: Diagnosis not present

## 2020-11-27 ENCOUNTER — Ambulatory Visit (HOSPITAL_BASED_OUTPATIENT_CLINIC_OR_DEPARTMENT_OTHER): Payer: Medicare Other | Attending: Cardiology | Admitting: Cardiology

## 2020-12-15 DIAGNOSIS — G8929 Other chronic pain: Secondary | ICD-10-CM | POA: Diagnosis not present

## 2020-12-15 DIAGNOSIS — Z79891 Long term (current) use of opiate analgesic: Secondary | ICD-10-CM | POA: Diagnosis not present

## 2020-12-15 DIAGNOSIS — M545 Low back pain, unspecified: Secondary | ICD-10-CM | POA: Diagnosis not present

## 2020-12-15 DIAGNOSIS — M5412 Radiculopathy, cervical region: Secondary | ICD-10-CM | POA: Diagnosis not present

## 2020-12-15 DIAGNOSIS — M5136 Other intervertebral disc degeneration, lumbar region: Secondary | ICD-10-CM | POA: Diagnosis not present

## 2020-12-15 DIAGNOSIS — M542 Cervicalgia: Secondary | ICD-10-CM | POA: Diagnosis not present

## 2020-12-15 DIAGNOSIS — G894 Chronic pain syndrome: Secondary | ICD-10-CM | POA: Diagnosis not present

## 2020-12-19 ENCOUNTER — Encounter: Payer: Medicare Other | Admitting: Podiatry

## 2020-12-27 ENCOUNTER — Ambulatory Visit: Payer: Medicare Other | Admitting: Podiatry

## 2021-01-12 DIAGNOSIS — M5412 Radiculopathy, cervical region: Secondary | ICD-10-CM | POA: Diagnosis not present

## 2021-01-12 DIAGNOSIS — G894 Chronic pain syndrome: Secondary | ICD-10-CM | POA: Diagnosis not present

## 2021-01-12 DIAGNOSIS — M542 Cervicalgia: Secondary | ICD-10-CM | POA: Diagnosis not present

## 2021-01-12 DIAGNOSIS — M545 Low back pain, unspecified: Secondary | ICD-10-CM | POA: Diagnosis not present

## 2021-01-12 DIAGNOSIS — G8929 Other chronic pain: Secondary | ICD-10-CM | POA: Diagnosis not present

## 2021-01-12 DIAGNOSIS — M5136 Other intervertebral disc degeneration, lumbar region: Secondary | ICD-10-CM | POA: Diagnosis not present

## 2021-01-12 DIAGNOSIS — Z79891 Long term (current) use of opiate analgesic: Secondary | ICD-10-CM | POA: Diagnosis not present

## 2021-01-16 ENCOUNTER — Other Ambulatory Visit: Payer: Self-pay

## 2021-01-16 ENCOUNTER — Ambulatory Visit (INDEPENDENT_AMBULATORY_CARE_PROVIDER_SITE_OTHER): Payer: Medicare Other | Admitting: Podiatry

## 2021-01-16 DIAGNOSIS — M79675 Pain in left toe(s): Secondary | ICD-10-CM

## 2021-01-16 DIAGNOSIS — Z9889 Other specified postprocedural states: Secondary | ICD-10-CM | POA: Diagnosis not present

## 2021-01-16 DIAGNOSIS — M2042 Other hammer toe(s) (acquired), left foot: Secondary | ICD-10-CM

## 2021-01-23 NOTE — Progress Notes (Signed)
  Subjective:  Patient ID: [name] [name], female    DOB: [birth year],  MRN: [id number]  Chief Complaint  Patient presents with   Routine Post Op    DOS 09/20/2020 LT 2ND HAMMERTOE CORRECTION   DOS: 09/20/20 Procedure: Hammertoe Correction 2nd toe left  60 y.o. female presents with the above complaint. History confirmed with patient. Denies pain in the 2nd toe. Patient complains of appearance - appears swollen.  Request care of her nails 1-5 on the left foot  Objective:  Physical Exam: no tenderness at the surgical site, local edema noted, and calf supple, nontender. Incision: healed   Assessment:   1. Hammer toe of left foot   2. Status post surgery   3. Pain in toe of left foot     Plan:  Patient was evaluated and treated and all questions answered.  Post-operative State -Discussed residual edema should resolve with time. No pain at present -Nails debrided courtesy   No follow-ups on file.

## 2021-02-13 DIAGNOSIS — G894 Chronic pain syndrome: Secondary | ICD-10-CM | POA: Diagnosis not present

## 2021-02-13 DIAGNOSIS — M5412 Radiculopathy, cervical region: Secondary | ICD-10-CM | POA: Diagnosis not present

## 2021-02-13 DIAGNOSIS — Z79891 Long term (current) use of opiate analgesic: Secondary | ICD-10-CM | POA: Diagnosis not present

## 2021-02-13 DIAGNOSIS — M542 Cervicalgia: Secondary | ICD-10-CM | POA: Diagnosis not present

## 2021-02-13 DIAGNOSIS — G8929 Other chronic pain: Secondary | ICD-10-CM | POA: Diagnosis not present

## 2021-02-13 DIAGNOSIS — M545 Low back pain, unspecified: Secondary | ICD-10-CM | POA: Diagnosis not present

## 2021-02-13 DIAGNOSIS — M5136 Other intervertebral disc degeneration, lumbar region: Secondary | ICD-10-CM | POA: Diagnosis not present

## 2021-03-02 ENCOUNTER — Ambulatory Visit (INDEPENDENT_AMBULATORY_CARE_PROVIDER_SITE_OTHER): Payer: Medicare Other | Admitting: Podiatry

## 2021-03-02 ENCOUNTER — Ambulatory Visit (INDEPENDENT_AMBULATORY_CARE_PROVIDER_SITE_OTHER): Payer: Medicare Other

## 2021-03-02 ENCOUNTER — Other Ambulatory Visit: Payer: Self-pay

## 2021-03-02 DIAGNOSIS — M722 Plantar fascial fibromatosis: Secondary | ICD-10-CM

## 2021-03-02 MED ORDER — BETAMETHASONE SOD PHOS & ACET 6 (3-3) MG/ML IJ SUSP
6.0000 mg | Freq: Once | INTRAMUSCULAR | Status: AC
  Administered 2021-03-02: 6 mg

## 2021-03-02 NOTE — Progress Notes (Signed)
  Subjective:  Patient ID: [name] [name], female    DOB: [birth year],  MRN: [id number]  Chief Complaint  Patient presents with   left ankle     Left ankle pain - started 1 week ago    60 y.o. female presents with the above complaint. History confirmed with patient. States the heel is causing a lot of pain. Feels like she might have plantar fasciitis  Objective:  Physical Exam: warm, good capillary refill, no trophic changes or ulcerative lesions, normal DP and PT pulses, and normal sensory exam. Left Foot: tenderness to palpation medial calcaneal tuber, no pain with calcaneal squeeze, decreased ankle joint ROM, and +Silverskiold test    Radiographs: X-ray of the left foot: no evidence of calcaneal stress fracture, plantar calcaneal spur, posterior calcaneal spur, and Haglund deformity noted  Assessment:   1. Plantar fasciitis    Plan:  Patient was evaluated and treated and all questions answered.  Plantar Fasciitis -XR reviewed with patient -Educated patient on stretching and icing of the affected limb -Plantar fascial brace dispensed -Injection delivered to the plantar fascia of the left foot.  Procedure: Injection Tendon/Ligament Consent: Verbal consent obtained. Location: Left plantar fascia at the glabrous junction; medial approach. Skin Prep: Alcohol. Injectate: 1 cc 0.5% marcaine plain, 1 cc betamethasone acetate-betamethasone sodium phosphate Disposition: Patient tolerated procedure well. Injection site dressed with a band-aid.  No follow-ups on file.

## 2021-03-02 NOTE — Progress Notes (Signed)
Dg left 

## 2021-03-12 ENCOUNTER — Telehealth: Payer: Self-pay

## 2021-03-12 NOTE — Telephone Encounter (Signed)
Letter has been sent to patient informing them that their sleep study has expired. Patient will need to call and schedule an office visit to re-evaluate the need for a sleep study.    

## 2021-03-13 DIAGNOSIS — M545 Low back pain, unspecified: Secondary | ICD-10-CM | POA: Diagnosis not present

## 2021-03-13 DIAGNOSIS — G894 Chronic pain syndrome: Secondary | ICD-10-CM | POA: Diagnosis not present

## 2021-03-13 DIAGNOSIS — M542 Cervicalgia: Secondary | ICD-10-CM | POA: Diagnosis not present

## 2021-03-13 DIAGNOSIS — Z79891 Long term (current) use of opiate analgesic: Secondary | ICD-10-CM | POA: Diagnosis not present

## 2021-03-13 DIAGNOSIS — M5136 Other intervertebral disc degeneration, lumbar region: Secondary | ICD-10-CM | POA: Diagnosis not present

## 2021-03-13 DIAGNOSIS — M5412 Radiculopathy, cervical region: Secondary | ICD-10-CM | POA: Diagnosis not present

## 2021-03-13 DIAGNOSIS — G8929 Other chronic pain: Secondary | ICD-10-CM | POA: Diagnosis not present

## 2021-03-25 ENCOUNTER — Inpatient Hospital Stay (HOSPITAL_COMMUNITY)
Admission: EM | Admit: 2021-03-25 | Discharge: 2021-03-28 | DRG: 177 | Disposition: A | Discharge status: Home / Self Care | Payer: Medicare Other | Attending: Internal Medicine | Admitting: Internal Medicine

## 2021-03-25 ENCOUNTER — Emergency Department (HOSPITAL_COMMUNITY): Payer: Medicare Other

## 2021-03-25 ENCOUNTER — Other Ambulatory Visit: Payer: Self-pay

## 2021-03-25 DIAGNOSIS — Z885 Allergy status to narcotic agent status: Secondary | ICD-10-CM

## 2021-03-25 DIAGNOSIS — U071 COVID-19: Principal | ICD-10-CM | POA: Diagnosis present

## 2021-03-25 DIAGNOSIS — Z9884 Bariatric surgery status: Secondary | ICD-10-CM

## 2021-03-25 DIAGNOSIS — I2699 Other pulmonary embolism without acute cor pulmonale: Secondary | ICD-10-CM | POA: Diagnosis present

## 2021-03-25 DIAGNOSIS — K219 Gastro-esophageal reflux disease without esophagitis: Secondary | ICD-10-CM | POA: Diagnosis not present

## 2021-03-25 DIAGNOSIS — Z87891 Personal history of nicotine dependence: Secondary | ICD-10-CM

## 2021-03-25 DIAGNOSIS — J45909 Unspecified asthma, uncomplicated: Secondary | ICD-10-CM | POA: Diagnosis not present

## 2021-03-25 DIAGNOSIS — Z8673 Personal history of transient ischemic attack (TIA), and cerebral infarction without residual deficits: Secondary | ICD-10-CM | POA: Diagnosis not present

## 2021-03-25 DIAGNOSIS — I2694 Multiple subsegmental pulmonary emboli without acute cor pulmonale: Secondary | ICD-10-CM | POA: Diagnosis present

## 2021-03-25 DIAGNOSIS — I110 Hypertensive heart disease with heart failure: Secondary | ICD-10-CM | POA: Diagnosis not present

## 2021-03-25 DIAGNOSIS — Z8249 Family history of ischemic heart disease and other diseases of the circulatory system: Secondary | ICD-10-CM

## 2021-03-25 DIAGNOSIS — I5032 Chronic diastolic (congestive) heart failure: Secondary | ICD-10-CM | POA: Diagnosis not present

## 2021-03-25 DIAGNOSIS — I2609 Other pulmonary embolism with acute cor pulmonale: Secondary | ICD-10-CM | POA: Diagnosis not present

## 2021-03-25 DIAGNOSIS — I471 Supraventricular tachycardia, unspecified: Secondary | ICD-10-CM | POA: Diagnosis present

## 2021-03-25 DIAGNOSIS — F32A Depression, unspecified: Secondary | ICD-10-CM | POA: Diagnosis not present

## 2021-03-25 DIAGNOSIS — Z880 Allergy status to penicillin: Secondary | ICD-10-CM

## 2021-03-25 DIAGNOSIS — Z7982 Long term (current) use of aspirin: Secondary | ICD-10-CM | POA: Diagnosis not present

## 2021-03-25 DIAGNOSIS — Z79899 Other long term (current) drug therapy: Secondary | ICD-10-CM

## 2021-03-25 DIAGNOSIS — I10 Essential (primary) hypertension: Secondary | ICD-10-CM | POA: Diagnosis not present

## 2021-03-25 DIAGNOSIS — R0602 Shortness of breath: Secondary | ICD-10-CM | POA: Diagnosis not present

## 2021-03-25 DIAGNOSIS — Z8616 Personal history of COVID-19: Secondary | ICD-10-CM

## 2021-03-25 DIAGNOSIS — G4730 Sleep apnea, unspecified: Secondary | ICD-10-CM | POA: Diagnosis not present

## 2021-03-25 LAB — COMPREHENSIVE METABOLIC PANEL
ALT: 17 U/L (ref 0–44)
AST: 21 U/L (ref 15–41)
Albumin: 3.9 g/dL (ref 3.5–5.0)
Alkaline Phosphatase: 87 U/L (ref 38–126)
Anion gap: 10 (ref 5–15)
BUN: 6 mg/dL — ABNORMAL LOW (ref 8–23)
CO2: 25 mmol/L (ref 22–32)
Calcium: 9.1 mg/dL (ref 8.9–10.3)
Chloride: 102 mmol/L (ref 98–111)
Creatinine, Ser: 0.91 mg/dL (ref 0.44–1.00)
GFR, Estimated: >60 mL/min (ref >60)
Glucose, Bld: 85 mg/dL (ref 70–99)
Potassium: 3.8 mmol/L (ref 3.5–5.1)
Sodium: 137 mmol/L (ref 135–145)
Total Bilirubin: 0.8 mg/dL (ref 0.3–1.2)
Total Protein: 7.7 g/dL (ref 6.5–8.1)

## 2021-03-25 LAB — CBC WITH DIFFERENTIAL/PLATELET
Abs Immature Granulocytes: 0.03 10*3/uL (ref 0.00–0.07)
Basophils Absolute: 0 10*3/uL (ref 0.0–0.1)
Basophils Relative: 0 %
Eosinophils Absolute: 0.1 10*3/uL (ref 0.0–0.5)
Eosinophils Relative: 1 %
HCT: 47.7 % — ABNORMAL HIGH (ref 36.0–46.0)
Hemoglobin: 15.8 g/dL — ABNORMAL HIGH (ref 12.0–15.0)
Immature Granulocytes: 0 %
Lymphocytes Relative: 23 %
Lymphs Abs: 2 10*3/uL (ref 0.7–4.0)
MCH: 28.6 pg (ref 26.0–34.0)
MCHC: 33.1 g/dL (ref 30.0–36.0)
MCV: 86.3 fL (ref 80.0–100.0)
Monocytes Absolute: 0.8 10*3/uL (ref 0.1–1.0)
Monocytes Relative: 9 %
Neutro Abs: 5.8 10*3/uL (ref 1.7–7.7)
Neutrophils Relative %: 67 %
Platelets: 224 10*3/uL (ref 150–400)
RBC: 5.53 MIL/uL — ABNORMAL HIGH (ref 3.87–5.11)
RDW: 13.7 % (ref 11.5–15.5)
WBC: 8.7 10*3/uL (ref 4.0–10.5)
nRBC: 0 % (ref 0.0–0.2)

## 2021-03-25 LAB — RESP PANEL BY RT-PCR (FLU A&B, COVID) ARPGX2
Influenza A by PCR: NEGATIVE
Influenza B by PCR: NEGATIVE
SARS Coronavirus 2 by RT PCR: POSITIVE — AB

## 2021-03-25 MED ORDER — KETOROLAC TROMETHAMINE 15 MG/ML IJ SOLN
15.0000 mg | Freq: Once | INTRAMUSCULAR | Status: DC
  Filled 2021-03-25: qty 1

## 2021-03-25 MED ORDER — HYDROCODONE-ACETAMINOPHEN 5-325 MG PO TABS
1.0000 | ORAL_TABLET | Freq: Once | ORAL | Status: AC
  Administered 2021-03-25: 23:00:00 1 via ORAL
  Filled 2021-03-25: qty 1

## 2021-03-25 MED ORDER — KETOROLAC TROMETHAMINE 15 MG/ML IJ SOLN
15.0000 mg | Freq: Once | INTRAMUSCULAR | Status: AC
  Administered 2021-03-25: 23:00:00 15 mg via INTRAMUSCULAR

## 2021-03-25 MED ORDER — LIDOCAINE 5 % EX PTCH
1.0000 | MEDICATED_PATCH | CUTANEOUS | Status: DC
  Administered 2021-03-25 – 2021-03-26 (×2): 1 via TRANSDERMAL
  Filled 2021-03-25 (×2): qty 1
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
## 2021-03-25 NOTE — ED Triage Notes (Signed)
Pt c/o SHOB x2 days, associated L sided rib/flank pain, productive cough. Advises that the last time she had symptoms, she had covid pneumonia. Vaccinated for covid, granddaughter sick.

## 2021-03-26 ENCOUNTER — Inpatient Hospital Stay (HOSPITAL_COMMUNITY): Payer: Medicare Other

## 2021-03-26 ENCOUNTER — Encounter (HOSPITAL_COMMUNITY): Payer: Self-pay | Admitting: Internal Medicine

## 2021-03-26 ENCOUNTER — Emergency Department (HOSPITAL_COMMUNITY): Payer: Medicare Other

## 2021-03-26 ENCOUNTER — Other Ambulatory Visit: Payer: Self-pay

## 2021-03-26 DIAGNOSIS — I2609 Other pulmonary embolism with acute cor pulmonale: Secondary | ICD-10-CM

## 2021-03-26 DIAGNOSIS — I5032 Chronic diastolic (congestive) heart failure: Secondary | ICD-10-CM | POA: Diagnosis present

## 2021-03-26 DIAGNOSIS — Z7982 Long term (current) use of aspirin: Secondary | ICD-10-CM | POA: Diagnosis not present

## 2021-03-26 DIAGNOSIS — G4730 Sleep apnea, unspecified: Secondary | ICD-10-CM | POA: Diagnosis present

## 2021-03-26 DIAGNOSIS — I110 Hypertensive heart disease with heart failure: Secondary | ICD-10-CM | POA: Diagnosis present

## 2021-03-26 DIAGNOSIS — I2699 Other pulmonary embolism without acute cor pulmonale: Secondary | ICD-10-CM

## 2021-03-26 DIAGNOSIS — Z8249 Family history of ischemic heart disease and other diseases of the circulatory system: Secondary | ICD-10-CM | POA: Diagnosis not present

## 2021-03-26 DIAGNOSIS — Z880 Allergy status to penicillin: Secondary | ICD-10-CM | POA: Diagnosis not present

## 2021-03-26 DIAGNOSIS — J45909 Unspecified asthma, uncomplicated: Secondary | ICD-10-CM | POA: Diagnosis present

## 2021-03-26 DIAGNOSIS — I10 Essential (primary) hypertension: Secondary | ICD-10-CM | POA: Diagnosis not present

## 2021-03-26 DIAGNOSIS — Z8673 Personal history of transient ischemic attack (TIA), and cerebral infarction without residual deficits: Secondary | ICD-10-CM | POA: Diagnosis not present

## 2021-03-26 DIAGNOSIS — Z9884 Bariatric surgery status: Secondary | ICD-10-CM | POA: Diagnosis not present

## 2021-03-26 DIAGNOSIS — K219 Gastro-esophageal reflux disease without esophagitis: Secondary | ICD-10-CM | POA: Diagnosis present

## 2021-03-26 DIAGNOSIS — Z885 Allergy status to narcotic agent status: Secondary | ICD-10-CM | POA: Diagnosis not present

## 2021-03-26 DIAGNOSIS — Z87891 Personal history of nicotine dependence: Secondary | ICD-10-CM | POA: Diagnosis not present

## 2021-03-26 DIAGNOSIS — U071 COVID-19: Principal | ICD-10-CM

## 2021-03-26 DIAGNOSIS — I471 Supraventricular tachycardia: Secondary | ICD-10-CM | POA: Diagnosis present

## 2021-03-26 DIAGNOSIS — Z8616 Personal history of COVID-19: Secondary | ICD-10-CM | POA: Diagnosis not present

## 2021-03-26 DIAGNOSIS — F32A Depression, unspecified: Secondary | ICD-10-CM | POA: Diagnosis present

## 2021-03-26 DIAGNOSIS — Z79899 Other long term (current) drug therapy: Secondary | ICD-10-CM | POA: Diagnosis not present

## 2021-03-26 DIAGNOSIS — I2694 Multiple subsegmental pulmonary emboli without acute cor pulmonale: Secondary | ICD-10-CM | POA: Diagnosis present

## 2021-03-26 LAB — ECHOCARDIOGRAM COMPLETE
AR max vel: 2.07 cm2
AV Area VTI: 2.09 cm2
AV Area mean vel: 1.94 cm2
AV Mean grad: 4 mmHg
AV Peak grad: 8.6 mmHg
Ao pk vel: 1.47 m/s
Area-P 1/2: 3.12 cm2
Calc EF: 61.1 %
Height: 64.5 in
MV VTI: 1.92 cm2
S' Lateral: 2.9 cm
Single Plane A2C EF: 62.8 %
Single Plane A4C EF: 58.8 %
Weight: 3440 oz

## 2021-03-26 LAB — TROPONIN I (HIGH SENSITIVITY): Troponin I (High Sensitivity): 5 ng/L (ref <18)

## 2021-03-26 LAB — HEPARIN LEVEL (UNFRACTIONATED)
Heparin Unfractionated: 0.53 IU/mL (ref 0.30–0.70)
Heparin Unfractionated: 0.32 IU/mL (ref 0.30–0.70)

## 2021-03-26 LAB — PROCALCITONIN: Procalcitonin: <0.1 ng/mL

## 2021-03-26 MED ORDER — KETOROLAC TROMETHAMINE 15 MG/ML IJ SOLN
15.0000 mg | Freq: Three times a day (TID) | INTRAMUSCULAR | Status: AC | PRN
  Administered 2021-03-26: 08:00:00 15 mg via INTRAVENOUS
  Filled 2021-03-26: qty 1

## 2021-03-26 MED ORDER — HEPARIN BOLUS VIA INFUSION
5000.0000 [IU] | Freq: Once | INTRAVENOUS | Status: AC
  Administered 2021-03-26: 03:00:00 5000 [IU] via INTRAVENOUS
  Filled 2021-03-26: qty 5000

## 2021-03-26 MED ORDER — HEPARIN (PORCINE) 25000 UT/250ML-% IV SOLN
1600.0000 [IU]/h | INTRAVENOUS | Status: DC
  Administered 2021-03-26 (×2): 1500 [IU]/h via INTRAVENOUS
  Filled 2021-03-26 (×3): qty 250

## 2021-03-26 MED ORDER — GABAPENTIN 800 MG PO TABS: 800.0000 mg | ORAL_TABLET | Freq: Two times a day (BID) | ORAL | Status: DC

## 2021-03-26 MED ORDER — BUPROPION HCL ER (XL) 300 MG PO TB24
300.0000 mg | ORAL_TABLET | Freq: Every day | ORAL | Status: DC
  Administered 2021-03-26 – 2021-03-28 (×3): 300 mg via ORAL
  Filled 2021-03-26 (×3): qty 1

## 2021-03-26 MED ORDER — TIZANIDINE HCL 4 MG PO TABS: 4.0000 mg | ORAL_TABLET | Freq: Three times a day (TID) | ORAL | Status: DC | PRN

## 2021-03-26 MED ORDER — NITROGLYCERIN 0.4 MG SL SUBL: 0.4000 mg | SUBLINGUAL_TABLET | SUBLINGUAL | Status: DC | PRN

## 2021-03-26 MED ORDER — METOPROLOL SUCCINATE ER 50 MG PO TB24
50.0000 mg | ORAL_TABLET | Freq: Two times a day (BID) | ORAL | Status: DC
  Administered 2021-03-26 – 2021-03-28 (×5): 50 mg via ORAL
  Filled 2021-03-26 (×4): qty 1
  Filled 2021-03-26: qty 2

## 2021-03-26 MED ORDER — IOHEXOL 350 MG/ML SOLN
75.0000 mL | Freq: Once | INTRAVENOUS | Status: AC | PRN
  Administered 2021-03-26: 01:00:00 75 mL via INTRAVENOUS

## 2021-03-26 MED ORDER — SODIUM CHLORIDE 0.9 % IV SOLN
100.0000 mg | Freq: Every day | INTRAVENOUS | Status: DC
  Administered 2021-03-27 – 2021-03-28 (×2): 100 mg via INTRAVENOUS
  Filled 2021-03-26 (×4): qty 20

## 2021-03-26 MED ORDER — FOLIC ACID 800 MCG PO TABS: 400.0000 ug | ORAL_TABLET | Freq: Every day | ORAL | Status: DC

## 2021-03-26 MED ORDER — FUROSEMIDE 40 MG PO TABS
40.0000 mg | ORAL_TABLET | Freq: Every day | ORAL | Status: DC
  Administered 2021-03-26 – 2021-03-28 (×3): 40 mg via ORAL
  Filled 2021-03-26: qty 1
  Filled 2021-03-26: qty 2
  Filled 2021-03-26: qty 1

## 2021-03-26 MED ORDER — ACETAMINOPHEN 650 MG RE SUPP: 650.0000 mg | Freq: Four times a day (QID) | RECTAL | Status: DC | PRN

## 2021-03-26 MED ORDER — VITAMIN B-12 100 MCG PO TABS
100.0000 ug | ORAL_TABLET | Freq: Every day | ORAL | Status: DC
  Administered 2021-03-26 – 2021-03-28 (×3): 100 ug via ORAL
  Filled 2021-03-26 (×3): qty 1

## 2021-03-26 MED ORDER — ALLOPURINOL 100 MG PO TABS
100.0000 mg | ORAL_TABLET | Freq: Every day | ORAL | Status: DC
  Administered 2021-03-26 – 2021-03-28 (×3): 100 mg via ORAL
  Filled 2021-03-26 (×3): qty 1

## 2021-03-26 MED ORDER — ACETAMINOPHEN 325 MG PO TABS: 650.0000 mg | ORAL_TABLET | Freq: Four times a day (QID) | ORAL | Status: DC | PRN

## 2021-03-26 MED ORDER — GABAPENTIN 400 MG PO CAPS
800.0000 mg | ORAL_CAPSULE | Freq: Two times a day (BID) | ORAL | Status: DC
  Administered 2021-03-26 – 2021-03-28 (×5): 800 mg via ORAL
  Filled 2021-03-26 (×5): qty 2

## 2021-03-26 MED ORDER — ASPIRIN 81 MG PO CHEW
81.0000 mg | CHEWABLE_TABLET | Freq: Every day | ORAL | Status: DC
  Administered 2021-03-26 – 2021-03-28 (×3): 81 mg via ORAL
  Filled 2021-03-26 (×3): qty 1

## 2021-03-26 MED ORDER — LUBIPROSTONE 24 MCG PO CAPS: 24.0000 ug | ORAL_CAPSULE | Freq: Two times a day (BID) | ORAL | Status: DC | PRN

## 2021-03-26 MED ORDER — FOLIC ACID 1 MG PO TABS
0.5000 mg | ORAL_TABLET | Freq: Every day | ORAL | Status: DC
  Administered 2021-03-26 – 2021-03-28 (×3): 0.5 mg via ORAL
  Filled 2021-03-26 (×3): qty 1

## 2021-03-26 MED ORDER — SODIUM CHLORIDE 0.9 % IV SOLN
200.0000 mg | Freq: Once | INTRAVENOUS | Status: AC
  Administered 2021-03-26: 06:00:00 200 mg via INTRAVENOUS
  Filled 2021-03-26: qty 40
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
## 2021-03-26 NOTE — ED Provider Notes (Signed)
Patient signed out to me with CT angio pending.  Patient with recent diagnosis of COVID presents to the emergency department with worsening shortness of breath and left-sided rib pain with breathing.  CT angiography has been performed and does show bilateral PEs with evidence of right heart strain.  Upon recheck patient is comfortable.  She is not experiencing any pain at rest.  No significant hypoxia.  Vital signs are stable.  With evidence of right heart strain, however, will require hospitalization.  Initiate heparinization.   Gilda Crease, MD 03/26/21 [phone]

## 2021-03-26 NOTE — ED Notes (Signed)
Patient transported to CT 
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
## 2021-03-26 NOTE — ED Notes (Signed)
Breakfast orders placed 

## 2021-03-26 NOTE — ED Notes (Signed)
Echo at bedside
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
## 2021-03-26 NOTE — Progress Notes (Signed)
This is a pleasant 60 year old lady with past medical history of diastolic congestive heart failure who presented to ED with shortness of breath and was diagnosed with unprovoked bilateral PE with right heart strain.  Patient was started on heparin drip.  Patient seen and examined in the ED, husband at the bedside.  Patient was also tested positive for COVID incidentally.  She was started on remdesivir.  Patient feels better than yesterday.  No shortness of breath.  Still complains of the pain in the lower left back.  Troponin 1 is negative, next is about to be done.  She is not hypoxic.  Slightly hypertensive.  We will continue heparin drip, echo pending.  Doppler lower extremity pending.  Continue remdesivir for incidental but asymptomatic COVID.  Resume Lasix for diastolic congestive heart failure, she appears euvolemic.  I had a lengthy discussion with the patient about benefits and risks of anticoagulation including but not limited to life-threatening internal bleeding.  She agreed to continue anticoagulation.

## 2021-03-26 NOTE — ED Notes (Signed)
Pt refused HIV testing despite education

## 2021-03-26 NOTE — Progress Notes (Signed)
VASCULAR LAB    Bilateral lower extremity venous duplex has been performed.  See CV proc for preliminary results.   Korrina Zern, RVT 03/26/2021, 11:49 AM
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
## 2021-03-27 ENCOUNTER — Other Ambulatory Visit (HOSPITAL_COMMUNITY): Payer: Self-pay

## 2021-03-27 DIAGNOSIS — I10 Essential (primary) hypertension: Secondary | ICD-10-CM

## 2021-03-27 LAB — CBC WITH DIFFERENTIAL/PLATELET
Abs Immature Granulocytes: 0.02 10*3/uL (ref 0.00–0.07)
Basophils Absolute: 0 10*3/uL (ref 0.0–0.1)
Basophils Relative: 0 %
Eosinophils Absolute: 0.1 10*3/uL (ref 0.0–0.5)
Eosinophils Relative: 2 %
HCT: 43.5 % (ref 36.0–46.0)
Hemoglobin: 14.5 g/dL (ref 12.0–15.0)
Immature Granulocytes: 0 %
Lymphocytes Relative: 27 %
Lymphs Abs: 1.8 10*3/uL (ref 0.7–4.0)
MCH: 28.5 pg (ref 26.0–34.0)
MCHC: 33.3 g/dL (ref 30.0–36.0)
MCV: 85.5 fL (ref 80.0–100.0)
Monocytes Absolute: 0.6 10*3/uL (ref 0.1–1.0)
Monocytes Relative: 9 %
Neutro Abs: 4.2 10*3/uL (ref 1.7–7.7)
Neutrophils Relative %: 62 %
Platelets: 206 10*3/uL (ref 150–400)
RBC: 5.09 MIL/uL (ref 3.87–5.11)
RDW: 13.8 % (ref 11.5–15.5)
WBC: 6.7 10*3/uL (ref 4.0–10.5)
nRBC: 0 % (ref 0.0–0.2)

## 2021-03-27 LAB — COMPREHENSIVE METABOLIC PANEL
ALT: 15 U/L (ref 0–44)
AST: 17 U/L (ref 15–41)
Albumin: 3 g/dL — ABNORMAL LOW (ref 3.5–5.0)
Alkaline Phosphatase: 76 U/L (ref 38–126)
Anion gap: 8 (ref 5–15)
BUN: 8 mg/dL (ref 8–23)
CO2: 25 mmol/L (ref 22–32)
Calcium: 8.7 mg/dL — ABNORMAL LOW (ref 8.9–10.3)
Chloride: 108 mmol/L (ref 98–111)
Creatinine, Ser: 0.87 mg/dL (ref 0.44–1.00)
GFR, Estimated: >60 mL/min (ref >60)
Glucose, Bld: 104 mg/dL — ABNORMAL HIGH (ref 70–99)
Potassium: 3.8 mmol/L (ref 3.5–5.1)
Sodium: 141 mmol/L (ref 135–145)
Total Bilirubin: 0.8 mg/dL (ref 0.3–1.2)
Total Protein: 6.4 g/dL — ABNORMAL LOW (ref 6.5–8.1)

## 2021-03-27 LAB — C-REACTIVE PROTEIN: CRP: 7.3 mg/dL — ABNORMAL HIGH (ref <1.0)

## 2021-03-27 LAB — HEPARIN LEVEL (UNFRACTIONATED): Heparin Unfractionated: 0.6 IU/mL (ref 0.30–0.70)

## 2021-03-27 LAB — D-DIMER, QUANTITATIVE: D-Dimer, Quant: 2.47 ug/mL-FEU — ABNORMAL HIGH (ref 0.00–0.50)

## 2021-03-27 MED ORDER — APIXABAN 5 MG PO TABS: 5.0000 mg | ORAL_TABLET | Freq: Two times a day (BID) | ORAL | Status: DC

## 2021-03-27 MED ORDER — APIXABAN 5 MG PO TABS
10.0000 mg | ORAL_TABLET | Freq: Two times a day (BID) | ORAL | Status: DC
  Administered 2021-03-27 – 2021-03-28 (×3): 10 mg via ORAL
  Filled 2021-03-27 (×3): qty 2
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
## 2021-03-27 NOTE — Plan of Care (Signed)

## 2021-03-27 NOTE — Progress Notes (Signed)
Occupational Therapy Discharge Patient Details Name: [name] [name] MRN: [id number] DOB: 07-Jan-1961 Today's Date: 03/27/2021 Time:  -     Patient discharged from OT services secondary to  Screen by PT Vicky. NO acute OT needs .  Please see latest therapy progress note for current level of functioning and progress toward goals.    Progress and discharge plan discussed with patient and/or caregiver:  patient agreeable  Deberah Pelton, OTR/L  Acute Rehabilitation Services Pager: [phone] Office: [phone] .  Mateo Flow 03/27/2021, 12:00 PM

## 2021-03-27 NOTE — Progress Notes (Signed)
PROGRESS NOTE    [name] [name]  GOV:[id number] DOB: [birth year] DOA: 03/25/2021 PCP: Lorenda Ishihara   Brief Narrative:          This is a pleasant 60 year old lady with past medical history of diastolic congestive heart failure who presented to ED with shortness of breath and was diagnosed with unprovoked bilateral PE with right heart strain.  Patient was started on heparin drip  Doppler lower extremity negative for DVT.    Assessment & Plan:   Principal Problem:   Pulmonary embolism (HCC) Active Problems:   Essential hypertension   Supraventricular tachycardia (HCC)   Chronic diastolic CHF (congestive heart failure) (HCC)   COVID-19 virus infection  Acute bilateral PE: Unprovoked.  Heart strain on the CT but echo did not show much heart strain.  Patient is feeling well, never became hypoxic.  Will transition to Eliquis.  Patient appears to be stable for discharge however she is very nervous and apprehensive about going home and she feels comfortable going home tomorrow so we will keep her today.  COVID-19 infection: Asymptomatic from COVID standpoint.  Chest x-ray negative for groundglass opacities.  Minimally elevated CRP.  Today is her day 2 of remdesivir, will give her third dose tomorrow and that should suffice.  Chronic diastolic congestive heart failure: Appears euvolemic.  Continue home remdesivir tomorrow Lasix.  History of SVT status post ablation: Continue metoprolol.  History of depression: Continue Wellbutrin.  DVT prophylaxis:    Code Status: Full Code  Family Communication:  None present at bedside.  Plan of care discussed with patient in length and he verbalized understanding and agreed with it.  Status is: Inpatient  Remains inpatient appropriate because: Patient uncomfortable going home today, also she needs third dose of remdesivir tomorrow  Estimated body mass index is 34.91 kg/m as calculated from the following:   Height as of this  encounter: 5' 4.5" (1.638 m).   Weight as of this encounter: 93.7 kg.  Nutritional Assessment: Body mass index is 34.91 kg/m.Marland Kitchen Seen by dietician.  I agree with the assessment and plan as outlined below: Nutrition Status:   Skin Assessment: I have examined the patient's skin and I agree with the wound assessment as performed by the wound care RN as outlined below:    Consultants:  None  Procedures:  None  Antimicrobials:  Anti-infectives (From admission, onward)    Start     Dose/Rate Route Frequency Ordered Stop   03/27/21 1000  remdesivir 100 mg in sodium chloride 0.9 % 100 mL IVPB       See Hyperspace for full Linked Orders Report.   100 mg 200 mL/hr over 30 Minutes Intravenous Daily 03/26/21 0334 03/31/21 0959   03/26/21 0500  remdesivir 200 mg in sodium chloride 0.9% 250 mL IVPB       See Hyperspace for full Linked Orders Report.   200 mg 580 mL/hr over 30 Minutes Intravenous Once 03/26/21 0334 03/26/21 0648          Subjective: Seen and examined.  No shortness of breath but he still has left flank pain which is improving.  No other complaint.  Objective: Vitals:   03/27/21 0412 03/27/21 0524 03/27/21 0837 03/27/21 1100  BP: (!) 136/94   137/89  Pulse: 79  77 85  Resp: 20  20 14   Temp: 98.7 F (37.1 C)   97.9 F (36.6 C)  TempSrc: Oral   Oral  SpO2: 94%  97% 96%  Weight:  93.7 kg  Height:        Intake/Output Summary (Last 24 hours) at 03/27/2021 1355 Last data filed at 03/27/2021 0911 Gross per 24 hour  Intake 698.11 ml  Output --  Net 698.11 ml   Filed Weights   03/26/21 0100 03/26/21 2030 03/27/21 0524  Weight: 97.5 kg 93.8 kg 93.7 kg    Examination:  General exam: Appears calm and comfortable  Respiratory system: Clear to auscultation. Respiratory effort normal. Cardiovascular system: S1 & S2 heard, RRR. No JVD, murmurs, rubs, gallops or clicks. No pedal edema. Gastrointestinal system: Abdomen is nondistended, soft and nontender. No  organomegaly or masses felt. Normal bowel sounds heard. Central nervous system: Alert and oriented. No focal neurological deficits. Extremities: Symmetric 5 x 5 power. Skin: No rashes, lesions or ulcers Psychiatry: Judgement and insight appear normal. Mood & affect appropriate.    Data Reviewed: I have personally reviewed following labs and imaging studies  CBC: Recent Labs  Lab 03/25/21 1853 03/27/21 0246  WBC 8.7 6.7  NEUTROABS 5.8 4.2  HGB 15.8* 14.5  HCT 47.7* 43.5  MCV 86.3 85.5  PLT 224 [id number]   Basic Metabolic Panel: Recent Labs  Lab 03/25/21 1853 03/27/21 0246  NA 137 141  K 3.8 3.8  CL 102 108  CO2 25 25  GLUCOSE 85 104*  BUN 6* 8  CREATININE 0.91 0.87  CALCIUM 9.1 8.7*   GFR: Estimated Creatinine Clearance: 75.1 mL/min (by C-G formula based on SCr of 0.87 mg/dL). Liver Function Tests: Recent Labs  Lab 03/25/21 1853 03/27/21 0246  AST 21 17  ALT 17 15  ALKPHOS 87 76  BILITOT 0.8 0.8  PROT 7.7 6.4*  ALBUMIN 3.9 3.0*   No results for input(s): LIPASE, AMYLASE in the last 168 hours. No results for input(s): AMMONIA in the last 168 hours. Coagulation Profile: No results for input(s): INR, PROTIME in the last 168 hours. Cardiac Enzymes: No results for input(s): CKTOTAL, CKMB, CKMBINDEX, TROPONINI in the last 168 hours. BNP (last 3 results) No results for input(s): PROBNP in the last 8760 hours. HbA1C: No results for input(s): HGBA1C in the last 72 hours. CBG: No results for input(s): GLUCAP in the last 168 hours. Lipid Profile: No results for input(s): CHOL, HDL, LDLCALC, TRIG, CHOLHDL, LDLDIRECT in the last 72 hours. Thyroid Function Tests: No results for input(s): TSH, T4TOTAL, FREET4, T3FREE, THYROIDAB in the last 72 hours. Anemia Panel: No results for input(s): VITAMINB12, FOLATE, FERRITIN, TIBC, IRON, RETICCTPCT in the last 72 hours. Sepsis Labs: Recent Labs  Lab 03/26/21 [id number]  PROCALCITON <0.10    Recent Results (from the past 240  hour(s))  Resp Panel by RT-PCR (Flu A&B, Covid) Nasopharyngeal Swab     Status: Abnormal   Collection Time: 03/25/21  6:53 PM   Specimen: Nasopharyngeal Swab; Nasopharyngeal(NP) swabs in vial transport medium  Result Value Ref Range Status   SARS Coronavirus 2 by RT PCR POSITIVE (A) NEGATIVE Final    Comment: (NOTE) SARS-CoV-2 target nucleic acids are DETECTED.  The SARS-CoV-2 RNA is generally detectable in upper respiratory specimens during the acute phase of infection. Positive results are indicative of the presence of the identified virus, but do not rule out bacterial infection or co-infection with other pathogens not detected by the test. Clinical correlation with patient history and other diagnostic information is necessary to determine patient infection status. The expected result is Negative.  Fact Sheet for Patients: EntrepreneurPulse.com.au  Fact Sheet for Healthcare Providers: IncredibleEmployment.be  This test is not yet approved or  cleared by the [place] and  has been authorized for detection and/or diagnosis of SARS-CoV-2 by FDA under an Emergency Use Authorization (EUA).  This EUA will remain in effect (meaning this test can be used) for the duration of  the COVID-19 declaration under Section 564(b)(1) of the A ct, 21 U.S.C. section 360bbb-3(b)(1), unless the authorization is terminated or revoked sooner.     Influenza A by PCR NEGATIVE NEGATIVE Final   Influenza B by PCR NEGATIVE NEGATIVE Final    Comment: (NOTE) The Xpert Xpress SARS-CoV-2/FLU/RSV plus assay is intended as an aid in the diagnosis of influenza from Nasopharyngeal swab specimens and should not be used as a sole basis for treatment. Nasal washings and aspirates are unacceptable for Xpert Xpress SARS-CoV-2/FLU/RSV testing.  Fact Sheet for Patients: EntrepreneurPulse.com.au  Fact Sheet for Healthcare  Providers: IncredibleEmployment.be  This test is not yet approved or cleared by the [place] FDA and has been authorized for detection and/or diagnosis of SARS-CoV-2 by FDA under an Emergency Use Authorization (EUA). This EUA will remain in effect (meaning this test can be used) for the duration of the COVID-19 declaration under Section 564(b)(1) of the Act, 21 U.S.C. section 360bbb-3(b)(1), unless the authorization is terminated or revoked.  Performed at [facility], [place] [address] [place], [place] [address]       Radiology Studies: DG Chest 2 View  Result Date: 03/25/2021 CLINICAL DATA:  Shortness of breath EXAM: CHEST - 2 VIEW COMPARISON:  Aug 16, 2020 FINDINGS: The heart size and mediastinal contours are within normal limits. Low lung volumes with bibasilar opacities favor atelectasis. No visible pleural effusion or pneumothorax. Left upper quadrant surgical clips. IMPRESSION: Low lung volumes with bibasilar opacities favor atelectasis although infiltrate not excluded. Electronically Signed   By: Dahlia Bailiff M.D.   On: 03/25/2021 19:15   CT Angio Chest PE W and/or Wo Contrast  Addendum Date: 03/26/2021   ADDENDUM REPORT: 03/26/2021 01:32 ADDENDUM: Critical findings were reported to Dr. Betsey Holiday at 1:31 a.m. Electronically Signed   By: Brett Fairy M.D.   On: 03/26/2021 01:32   Result Date: 03/26/2021 CLINICAL DATA:  Shortness of breath, left rib pain. PE suspected, high probability. EXAM: CT ANGIOGRAPHY CHEST WITH CONTRAST TECHNIQUE: Multidetector CT imaging of the chest was performed using the standard protocol during bolus administration of intravenous contrast. Multiplanar CT image reconstructions and MIPs were obtained to evaluate the vascular anatomy. CONTRAST:  108mL OMNIPAQUE IOHEXOL 350 MG/ML SOLN COMPARISON:  05/03/2020. FINDINGS: Cardiovascular: The heart is normal in size and there is no pericardial effusion. The aorta is normal in caliber.  The pulmonary trunk is mildly distended which may be associated with underlying pulmonary artery hypertension. Pulmonary artery filling defects are identified in the right pulmonary trunk extending into the right upper, middle, and lower lobes. Segmental pulmonary artery filling defects are identified in the left upper and lower lobes. The right ventricle is mildly distended, suggesting underlying pulmonary artery hypertension. Mediastinum/Nodes: Shotty lymph nodes are present in the mediastinum and left hilum. No axillary lymphadenopathy. The thyroid gland and trachea are within normal limits. There is a small hiatal hernia with a small amount of fluid in the distal esophagus, possible reflux. Lungs/Pleura: Strandy opacities are present at the lung bases. There is a small left pleural effusion. No pneumothorax. Upper Abdomen: Gastric surgery changes are noted. No acute abnormality. Musculoskeletal: No acute fracture. Review of the MIP images confirms the above findings. IMPRESSION: 1. Bilateral pulmonary emboli. The right  ventricle is distended suggesting right heart strain. 2. Mildly distended pulmonary trunk, which may be associated with underlying pulmonary artery hypertension. 3. Strandy opacities at the lung bases, possible atelectasis or infiltrate. 4. Small left pleural effusion. 5. Small hiatal hernia with fluid in the distal esophagus, possible reflux. Electronically Signed: By: Brett Fairy M.D. On: 03/26/2021 01:28   ECHOCARDIOGRAM COMPLETE  Result Date: 03/26/2021    ECHOCARDIOGRAM REPORT   Patient Name:   MEGEAN YONEMURA Date of Exam: 03/26/2021 Medical Rec #:  [id number]                  Height:       64.5 in Accession #:    [id number]                 Weight:       215.0 lb Date of Birth:  [birth year]                  BSA:          2.028 m Patient Age:    29 years                   BP:           145/94 mmHg Patient Gender: F                          HR:           79 bpm. Exam Location:   Inpatient Procedure: 2D Echo, Cardiac Doppler and Color Doppler Indications:    Pulmonary Embolus  History:        Patient has prior history of Echocardiogram examinations, most                 recent 03/05/2018. CHF, Arrythmias:Tachycardia; Risk                 Factors:Hypertension. COVID +.  Sonographer:    Wenda Low Referring Phys: [place]  Sonographer Comments: Patient is morbidly obese. IMPRESSIONS  1. Left ventricular ejection fraction, by estimation, is 60 to 65%. The left ventricle has normal function. The left ventricle has no regional wall motion abnormalities. Left ventricular diastolic parameters are consistent with Grade I diastolic dysfunction (impaired relaxation).  2. Right ventricular systolic function is normal. The right ventricular size is normal. There is mildly elevated pulmonary artery systolic pressure.  3. The mitral valve is normal in structure. No evidence of mitral valve regurgitation. No evidence of mitral stenosis.  4. The aortic valve is normal in structure. Aortic valve regurgitation is not visualized. No aortic stenosis is present.  5. The inferior vena cava is normal in size with greater than 50% respiratory variability, suggesting right atrial pressure of 3 mmHg. FINDINGS  Left Ventricle: Left ventricular ejection fraction, by estimation, is 60 to 65%. The left ventricle has normal function. The left ventricle has no regional wall motion abnormalities. The left ventricular internal cavity size was normal in size. There is  no left ventricular hypertrophy. Left ventricular diastolic parameters are consistent with Grade I diastolic dysfunction (impaired relaxation). Normal left ventricular filling pressure. Right Ventricle: The right ventricular size is normal. No increase in right ventricular wall thickness. Right ventricular systolic function is normal. There is mildly elevated pulmonary artery systolic pressure. The tricuspid regurgitant velocity is 2.88   m/s, and with an assumed right atrial pressure of 3 mmHg, the estimated right ventricular systolic  pressure is 36.2 mmHg. Left Atrium: Left atrial size was normal in size. Right Atrium: Right atrial size was normal in size. Pericardium: There is no evidence of pericardial effusion. Mitral Valve: The mitral valve is normal in structure. No evidence of mitral valve regurgitation. No evidence of mitral valve stenosis. MV peak gradient, 6.2 mmHg. The mean mitral valve gradient is 2.0 mmHg. Tricuspid Valve: The tricuspid valve is normal in structure. Tricuspid valve regurgitation is mild . No evidence of tricuspid stenosis. Aortic Valve: The aortic valve is normal in structure. Aortic valve regurgitation is not visualized. No aortic stenosis is present. Aortic valve mean gradient measures 4.0 mmHg. Aortic valve peak gradient measures 8.6 mmHg. Aortic valve area, by VTI measures 2.09 cm. Pulmonic Valve: The pulmonic valve was normal in structure. Pulmonic valve regurgitation is not visualized. No evidence of pulmonic stenosis. Aorta: The aortic root is normal in size and structure. Venous: The inferior vena cava is normal in size with greater than 50% respiratory variability, suggesting right atrial pressure of 3 mmHg. IAS/Shunts: No atrial level shunt detected by color flow Doppler.  LEFT VENTRICLE PLAX 2D LVIDd:         4.40 cm     Diastology LVIDs:         2.90 cm     LV e' medial:    7.45 cm/s LV PW:         1.10 cm     LV E/e' medial:  9.0 LV IVS:        1.00 cm     LV e' lateral:   9.02 cm/s LVOT diam:     2.00 cm     LV E/e' lateral: 7.4 LV SV:         63 LV SV Index:   31 LVOT Area:     3.14 cm  LV Volumes (MOD) LV vol d, MOD A2C: 83.3 ml LV vol d, MOD A4C: 91.8 ml LV vol s, MOD A2C: 31.0 ml LV vol s, MOD A4C: 37.8 ml LV SV MOD A2C:     52.3 ml LV SV MOD A4C:     91.8 ml LV SV MOD BP:      53.7 ml RIGHT VENTRICLE RV Basal diam:  3.20 cm RV Mid diam:    2.80 cm RV S prime:     14.30 cm/s LEFT ATRIUM              Index        RIGHT ATRIUM           Index LA diam:        3.40 cm 1.68 cm/m   RA Area:     15.00 cm LA Vol (A2C):   65.6 ml 32.34 ml/m  RA Volume:   33.30 ml  16.42 ml/m LA Vol (A4C):   34.1 ml 16.81 ml/m LA Biplane Vol: 47.4 ml 23.37 ml/m  AORTIC VALVE                    PULMONIC VALVE AV Area (Vmax):    2.07 cm     PV Vmax:       0.72 m/s AV Area (Vmean):   1.94 cm     PV Peak grad:  2.1 mmHg AV Area (VTI):     2.09 cm AV Vmax:           147.00 cm/s AV Vmean:          94.400 cm/s AV VTI:  0.301 m AV Peak Grad:      8.6 mmHg AV Mean Grad:      4.0 mmHg LVOT Vmax:         97.00 cm/s LVOT Vmean:        58.300 cm/s LVOT VTI:          0.200 m LVOT/AV VTI ratio: 0.66  AORTA Ao Root diam: 3.10 cm MITRAL VALVE                TRICUSPID VALVE MV Area (PHT): 3.12 cm     TR Peak grad:   33.2 mmHg MV Area VTI:   1.92 cm     TR Vmax:        288.00 cm/s MV Peak grad:  6.2 mmHg MV Mean grad:  2.0 mmHg     SHUNTS MV Vmax:       1.24 m/s     Systemic VTI:  0.20 m MV Vmean:      58.9 cm/s    Systemic Diam: 2.00 cm MV Decel Time: 243 msec MV E velocity: 66.90 cm/s MV A velocity: 113.00 cm/s MV E/A ratio:  0.59 Mihai Croitoru MD Electronically signed by Sanda Klein MD Signature Date/Time: 03/26/2021/1:38:29 PM    Final    VAS [place] LOWER EXTREMITY VENOUS (DVT)  Result Date: 03/26/2021  Lower Venous DVT Study Patient Name:  ETOLIA SPATAFORA  Date of Exam:   03/26/2021 Medical Rec #: [id number]                   Accession #:    [id number] Date of Birth: 03-Jun-1960                   Patient Gender: F Patient Age:   26 years Exam Location:  [facility] Procedure:      VAS [place] LOWER EXTREMITY VENOUS (DVT) Referring Phys: Gean Birchwood --------------------------------------------------------------------------------  Indications: Pulmonary embolism, and Covid-19.  Comparison Study: No prior study on file Performing Technologist: Sharion Dove RVS  Examination Guidelines: A complete evaluation  includes B-mode imaging, spectral Doppler, color Doppler, and power Doppler as needed of all accessible portions of each vessel. Bilateral testing is considered an integral part of a complete examination. Limited examinations for reoccurring indications may be performed as noted. The reflux portion of the exam is performed with the patient in reverse Trendelenburg.  +---------+---------------+---------+-----------+----------+--------------+  RIGHT     Compressibility Phasicity Spontaneity Properties Thrombus Aging  +---------+---------------+---------+-----------+----------+--------------+  CFV       Full            Yes       Yes                                    +---------+---------------+---------+-----------+----------+--------------+  SFJ       Full                                                             +---------+---------------+---------+-----------+----------+--------------+  FV Prox   Full                                                             +---------+---------------+---------+-----------+----------+--------------+  FV Mid    Full                                                             +---------+---------------+---------+-----------+----------+--------------+  FV Distal Full                                                             +---------+---------------+---------+-----------+----------+--------------+  PFV       Full                                                             +---------+---------------+---------+-----------+----------+--------------+  POP       Full            Yes       Yes                                    +---------+---------------+---------+-----------+----------+--------------+  PTV       Full                                                             +---------+---------------+---------+-----------+----------+--------------+  PERO      Full                                                              +---------+---------------+---------+-----------+----------+--------------+   +---------+---------------+---------+-----------+----------+--------------+  LEFT      Compressibility Phasicity Spontaneity Properties Thrombus Aging  +---------+---------------+---------+-----------+----------+--------------+  CFV       Full            Yes       Yes                                    +---------+---------------+---------+-----------+----------+--------------+  SFJ       Full                                                             +---------+---------------+---------+-----------+----------+--------------+  FV Prox   Full                                                             +---------+---------------+---------+-----------+----------+--------------+  FV Mid    Full                                                             +---------+---------------+---------+-----------+----------+--------------+  FV Distal Full                                                             +---------+---------------+---------+-----------+----------+--------------+  PFV       Full                                                             +---------+---------------+---------+-----------+----------+--------------+  POP       Full            Yes       Yes                                    +---------+---------------+---------+-----------+----------+--------------+  PTV       Full                                                             +---------+---------------+---------+-----------+----------+--------------+  PERO      Full                                                             +---------+---------------+---------+-----------+----------+--------------+     Summary: BILATERAL: - No evidence of deep vein thrombosis seen in the lower extremities, bilaterally. -No evidence of popliteal cyst, bilaterally.   *See table(s) above for measurements and observations. Electronically signed by Heath Lark on 03/26/2021 at 1:07:20 PM.     Final     Scheduled Meds:  allopurinol  100 mg Oral Daily   apixaban  10 mg Oral BID   Followed by   Melene Muller ON 04/03/2021] apixaban  5 mg Oral BID   aspirin  81 mg Oral Daily   buPROPion  300 mg Oral Daily   folic acid  0.5 mg Oral Daily   furosemide  40 mg Oral Daily   gabapentin  800 mg Oral BID   lidocaine  1 patch Transdermal Q24H   metoprolol succinate  50 mg Oral BID   vitamin B-12  100 mcg Oral Daily   Continuous Infusions:  remdesivir 100 mg in NS 100 mL 100 mg (03/27/21 1115)     LOS: 1 day   Time spent: 27 minutes   Hughie Closs, MD [facility]  03/27/2021, 1:55 PM  Please page  via Amion and do not message via secure chat for anything urgent. Secure chat can be used for anything non urgent.  How to contact the [facility] Attending or Consulting provider [place] or covering provider during after hours [place], for this patient?  Check the care team in [facility] and look for a) attending/consulting [facility] provider listed and b) the [facility] team listed. Page or secure chat [phone]. Log into [email or web address] and use 's universal password to access. If you do not have the password, please contact the hospital operator. Locate the [facility] provider you are looking for under [facility] and page to a number that you can be directly reached. If you still have difficulty reaching the provider, please page the [facility] (Director on Call) for the Hospitalists listed on amion for assistance.

## 2021-03-27 NOTE — TOC Benefit Eligibility Note (Signed)
Patient Product/process development scientist completed.    The patient is currently admitted and upon discharge could be taking Elqius 5 mg.  The current 30 day co-pay is, $0.00.   The patient is currently admitted and upon discharge could be taking Xarelto 20 mg.  The current 30 day co-pay is, $0.00.   The patient is insured through [employer]    Roland Earl, CPhT Pharmacy Patient Advocate Specialist [facility] Patient Advocate Team Direct Number: [phone]  Fax: [phone]

## 2021-03-27 NOTE — Evaluation (Signed)
Physical Therapy Evaluation and Discharge Patient Details Name: [name] [name] MRN: [id number] DOB: March 09, 1961 Today's Date: 03/27/2021  History of Present Illness  Pt is 60 yo female, presents with SOB and found to have B PE as well as being covid +. PMH: CHF, DM, GERD, TIA 2010, syncope, anxiety  Clinical Impression  Patient evaluated by Physical Therapy with no further acute PT needs identified. All education has been completed and the patient has no further questions. Pt independent with mobility and SPO2 remained 96-97% on RA with HR in 80's with ambulation. Pt instructed to ambulate in room every 2 hrs 5-10 mins. Requested incentive spirometer from RN as pt reports back pain with deep inspiration and is somewhat avoidant (and has h/o PNA in 2021). Educated pt in use of IS. No further PT or OT needs at this time.  See below for any follow-up Physical Therapy or equipment needs. PT is signing off. Thank you for this referral.        Recommendations for follow up therapy are one component of a multi-disciplinary discharge planning process, led by the attending physician.  Recommendations may be updated based on patient status, additional functional criteria and insurance authorization.  Follow Up Recommendations No PT follow up    Assistance Recommended at Discharge None  Functional Status Assessment Patient has not had a recent decline in their functional status  Equipment Recommendations  None recommended by PT    Recommendations for Other Services       Precautions / Restrictions Precautions Precautions: None Restrictions Weight Bearing Restrictions: No      Mobility  Bed Mobility Overal bed mobility: Independent                  Transfers Overall transfer level: Independent Equipment used: None                    Ambulation/Gait Ambulation/Gait assistance: Independent Gait Distance (Feet): 50 Feet (2x) Assistive device: None Gait  Pattern/deviations: Step-through pattern;Antalgic Gait velocity: WFL Gait velocity interpretation: >2.62 ft/sec, indicative of community ambulatory   General Gait Details: antalgic with increased distance due to knee OA and chronic back pain  Stairs            Wheelchair Mobility    Modified Rankin (Stroke Patients Only)       Balance Overall balance assessment: Mild deficits observed, not formally tested                                           Pertinent Vitals/Pain Pain Assessment: Faces Faces Pain Scale: Hurts little more Pain Location: back with deep inspiration Pain Descriptors / Indicators: Tightness Pain Intervention(s): Monitored during session    Home Living Family/patient expects to be discharged to:: Private residence Living Arrangements: Spouse/significant other Available Help at Discharge: Family;Available 24 hours/day Type of Home: House Home Access: Stairs to enter   [employer] of Steps: 2   Home Layout: One level Home Equipment: None      Prior Function Prior Level of Function : Independent/Modified Independent             Mobility Comments: drives, cooks, cleans ADLs Comments: independent     Hand Dominance        Extremity/Trunk Assessment   Upper Extremity Assessment Upper Extremity Assessment: Overall WFL for tasks assessed    Lower Extremity Assessment Lower  Extremity Assessment: Overall WFL for tasks assessed    Cervical / Trunk Assessment Cervical / Trunk Assessment: Normal  Communication   Communication: No difficulties  Cognition Arousal/Alertness: Awake/alert Behavior During Therapy: WFL for tasks assessed/performed Overall Cognitive Status: Within Functional Limits for tasks assessed                                 General Comments: h/o bipolar, PTSD, does not sleep well at night because of nightmares        General Comments General comments (skin integrity,  edema, etc.): SPO2 96-97% on RA, HR in 80's with activity. DIscussed use of IS as pt reports avoiding deep breaths. RN notified and working on getting IS to room    Exercises     Assessment/Plan    PT Assessment Patient does not need any further PT services  PT Problem List Decreased mobility;Pain       PT Treatment Interventions      PT Goals (Current goals can be found in the Care Plan section)  Acute Rehab PT Goals Patient Stated Goal: clot to be gone in lungs and then go home PT Goal Formulation: All assessment and education complete, DC therapy    Frequency     Barriers to discharge        Co-evaluation               AM-PAC PT "6 Clicks" Mobility  Outcome Measure Help needed turning from your back to your side while in a flat bed without using bedrails?: None Help needed moving from lying on your back to sitting on the side of a flat bed without using bedrails?: None Help needed moving to and from a bed to a chair (including a wheelchair)?: None Help needed standing up from a chair using your arms (e.g., wheelchair or bedside chair)?: None Help needed to walk in hospital room?: None Help needed climbing 3-5 steps with a railing? : None 6 Click Score: 24    End of Session   Activity Tolerance: Patient tolerated treatment well Patient left: in bed;with call bell/phone within reach Nurse Communication: Mobility status (need for IS) PT Visit Diagnosis: Unsteadiness on feet (R26.81);Pain Pain - part of body:  (back)    Time: 0900-0930 PT Time Calculation (min) (ACUTE ONLY): 30 min   Charges:   PT Evaluation $PT Eval Moderate Complexity: 1 Mod PT Treatments $Gait Training: 8-22 mins        Lyanne Co, PT  Acute Rehab Services  Pager [phone] Office [phone]   [name] [name] 03/27/2021, 9:41 AM

## 2021-03-27 NOTE — Progress Notes (Signed)
ANTICOAGULATION CONSULT NOTE - Follow Up Consult  Pharmacy Consult for heparin Indication: pulmonary embolus   Labs: Recent Labs    03/25/21 1853 03/26/21 0533 03/26/21 0932 03/26/21 1700 03/27/21 0246  HGB 15.8*  --   --   --  14.5  HCT 47.7*  --   --   --  43.5  PLT 224  --   --   --  206  HEPARINUNFRC  --   --  0.53 0.32 0.60  CREATININE 0.91  --   --   --  0.87  TROPONINIHS  --  5  --   --   --     Assessment/Plan:  60yo female therapeutic on heparin after rate change. Will continue infusion at current rate of 1600 units/hr and confirm stable with additional level.   Vernard Gambles, PharmD, BCPS  03/27/2021,4:04 AM

## 2021-03-28 LAB — COMPREHENSIVE METABOLIC PANEL WITH GFR
ALT: 13 U/L (ref 0–44)
AST: 16 U/L (ref 15–41)
Albumin: 3.1 g/dL — ABNORMAL LOW (ref 3.5–5.0)
Alkaline Phosphatase: 69 U/L (ref 38–126)
Anion gap: 7 (ref 5–15)
BUN: 9 mg/dL (ref 8–23)
CO2: 24 mmol/L (ref 22–32)
Calcium: 8.8 mg/dL — ABNORMAL LOW (ref 8.9–10.3)
Chloride: 109 mmol/L (ref 98–111)
Creatinine, Ser: 0.85 mg/dL (ref 0.44–1.00)
GFR, Estimated: >60 mL/min
Glucose, Bld: 105 mg/dL — ABNORMAL HIGH (ref 70–99)
Potassium: 4 mmol/L (ref 3.5–5.1)
Sodium: 140 mmol/L (ref 135–145)
Total Bilirubin: 0.7 mg/dL (ref 0.3–1.2)
Total Protein: 6.2 g/dL — ABNORMAL LOW (ref 6.5–8.1)

## 2021-03-28 LAB — CBC WITH DIFFERENTIAL/PLATELET
Abs Immature Granulocytes: 0.03 K/uL (ref 0.00–0.07)
Basophils Absolute: 0 K/uL (ref 0.0–0.1)
Basophils Relative: 0 %
Eosinophils Absolute: 0.2 K/uL (ref 0.0–0.5)
Eosinophils Relative: 3 %
HCT: 42.8 % (ref 36.0–46.0)
Hemoglobin: 14.4 g/dL (ref 12.0–15.0)
Immature Granulocytes: 1 %
Lymphocytes Relative: 25 %
Lymphs Abs: 1.6 K/uL (ref 0.7–4.0)
MCH: 28.7 pg (ref 26.0–34.0)
MCHC: 33.6 g/dL (ref 30.0–36.0)
MCV: 85.3 fL (ref 80.0–100.0)
Monocytes Absolute: 0.4 K/uL (ref 0.1–1.0)
Monocytes Relative: 6 %
Neutro Abs: 4.3 K/uL (ref 1.7–7.7)
Neutrophils Relative %: 65 %
Platelets: 213 K/uL (ref 150–400)
RBC: 5.02 MIL/uL (ref 3.87–5.11)
RDW: 13.6 % (ref 11.5–15.5)
WBC: 6.5 K/uL (ref 4.0–10.5)
nRBC: 0 % (ref 0.0–0.2)

## 2021-03-28 LAB — D-DIMER, QUANTITATIVE: D-Dimer, Quant: 2.01 ug{FEU}/mL — ABNORMAL HIGH (ref 0.00–0.50)

## 2021-03-28 LAB — C-REACTIVE PROTEIN: CRP: 3.3 mg/dL — ABNORMAL HIGH (ref <1.0)

## 2021-03-28 MED ORDER — APIXABAN 5 MG PO TABS: 10.0000 mg | ORAL_TABLET | Freq: Two times a day (BID) | ORAL | 0 refills | Status: DC

## 2021-03-28 MED ORDER — APIXABAN 5 MG PO TABS: 5.0000 mg | ORAL_TABLET | Freq: Two times a day (BID) | ORAL | 2 refills | Status: DC

## 2021-03-28 NOTE — Plan of Care (Signed)

## 2021-03-28 NOTE — Plan of Care (Signed)
°  Problem: Education: Goal: Knowledge of General Education information will improve Description: Including pain rating scale, medication(s)/side effects and non-pharmacologic comfort measures 03/28/2021 1005 by Dallas Breeding, RN Outcome: Adequate for Discharge 03/28/2021 0757 by Dallas Breeding, RN Outcome: Progressing   Problem: Health Behavior/Discharge Planning: Goal: Ability to manage health-related needs will improve 03/28/2021 1005 by Dallas Breeding, RN Outcome: Adequate for Discharge 03/28/2021 0757 by Dallas Breeding, RN Outcome: Progressing   Problem: Clinical Measurements: Goal: Ability to maintain clinical measurements within normal limits will improve 03/28/2021 1005 by Dallas Breeding, RN Outcome: Adequate for Discharge 03/28/2021 0757 by Dallas Breeding, RN Outcome: Progressing Goal: Will remain free from infection Outcome: Adequate for Discharge Goal: Diagnostic test results will improve Outcome: Adequate for Discharge Goal: Respiratory complications will improve Outcome: Adequate for Discharge   Problem: Activity: Goal: Risk for activity intolerance will decrease 03/28/2021 1005 by Dallas Breeding, RN Outcome: Adequate for Discharge 03/28/2021 0757 by Dallas Breeding, RN Outcome: Progressing   Problem: Nutrition: Goal: Adequate nutrition will be maintained 03/28/2021 1005 by Dallas Breeding, RN Outcome: Adequate for Discharge 03/28/2021 0757 by Dallas Breeding, RN Outcome: Progressing   Problem: Coping: Goal: Level of anxiety will decrease 03/28/2021 1005 by Dallas Breeding, RN Outcome: Adequate for Discharge 03/28/2021 0757 by Dallas Breeding, RN Outcome: Progressing   Problem: Elimination: Goal: Will not experience complications related to urinary retention Outcome: Adequate for Discharge   Problem: Pain Managment: Goal: General experience of comfort will  improve 03/28/2021 1005 by Dallas Breeding, RN Outcome: Adequate for Discharge 03/28/2021 0757 by Dallas Breeding, RN Outcome: Progressing   Problem: Safety: Goal: Ability to remain free from injury will improve Outcome: Adequate for Discharge   Problem: Skin Integrity: Goal: Risk for impaired skin integrity will decrease Outcome: Adequate for Discharge

## 2021-03-28 NOTE — Discharge Summary (Signed)
Physician Discharge Summary  [name] [name] WUJ:[id number] DOB: [birth year] DOA: 03/25/2021  PCP: Lorenda Ishihara  Admit date: 03/25/2021 Discharge date: 03/28/2021  Admitted From: Home Disposition: Home  Recommendations for Outpatient Follow-up:  Follow up with PCP in 1-2 weeks Please obtain BMP/CBC in one week   Home Health: N/A Equipment/Devices: N/A  Discharge Condition: Stable CODE STATUS: Full code Diet recommendation: Low-salt diet  Discharge summary: 60 year old female who was suffering from about 1 week of cough and congestion came to the ER with shortness of breath and she was found to have unprovoked bilateral subsegmental PE and positive COVID-19 infection.  No evidence of pneumonia.  Initial CT scan showed evidence of right heart strain, however echocardiogram with no evidence of right heart strain.  Patient had been on room air throughout.  She was initially started on heparin, then converted to Eliquis.  Mobilized around and able to walk on room air and chest pain or shortness of breath has improved.  She also completed 3 days of remdesivir therapy.  Does have history of SVT status post ablation on metoprolol and currently sinus rhythm.  Bilateral unprovoked PE likely due to COVID-19 infection: Tolerating Eliquis.  Will be discharged home on Eliquis.  Will need reevaluation/reassessment in about 3 months to continue Eliquis therapy.  On room air.  Completed 3 days of remdesivir.  No further treatment needed.  Resume Wellbutrin from home.  Patient takes aspirin 81 mg daily after her SVT ablation.  She does not have any coronary stent.  Since patient is taking Eliquis, discontinue aspirin to avoid bleeding risk.   Discharge Diagnoses:  Principal Problem:   Pulmonary embolism (HCC) Active Problems:   Essential hypertension   Supraventricular tachycardia (HCC)   Chronic diastolic CHF (congestive heart failure) (HCC)   COVID-19 virus  infection    Discharge Instructions  Discharge Instructions     Call MD for:  difficulty breathing, headache or visual disturbances   Complete by: As directed    Call MD for:  extreme fatigue   Complete by: As directed    Call MD for:  severe uncontrolled pain   Complete by: As directed    Diet - low sodium heart healthy   Complete by: As directed    Increase activity slowly   Complete by: As directed       Allergies as of 03/28/2021       Reactions   Penicillins Other (See Comments)   Has not had medication but lists it; states able to take amoxicillin without any problems   Codeine Rash   Can tolerate Percocet        Medication List     STOP taking these medications    aspirin 81 MG EC tablet   ciclopirox 8 % solution Commonly known as: PENLAC   clindamycin 150 MG capsule Commonly known as: Cleocin   fluconazole 150 MG tablet Commonly known as: DIFLUCAN   lubiprostone 24 MCG capsule Commonly known as: AMITIZA   ondansetron 4 MG tablet Commonly known as: Zofran   oxyCODONE-acetaminophen 10-325 MG tablet Commonly known as: PERCOCET       TAKE these medications    allopurinol 100 MG tablet Commonly known as: ZYLOPRIM Take 100 mg by mouth daily.   apixaban 5 MG Tabs tablet Commonly known as: ELIQUIS Take 2 tablets (10 mg total) by mouth 2 (two) times daily for 5 days.   apixaban 5 MG Tabs tablet Commonly known as: ELIQUIS Take 1 tablet (5 mg total)  by mouth 2 (two) times daily. Start taking on: April 03, 2021   buPROPion 300 MG 24 hr tablet Commonly known as: WELLBUTRIN XL Take 300 mg by mouth daily.   cariprazine 1.5 MG capsule Commonly known as: VRAYLAR Take 3 mg by mouth daily.   folic acid 800 MCG tablet Commonly known as: FOLVITE Take 400 mcg by mouth daily.   furosemide 40 MG tablet Commonly known as: LASIX Take 1 tablet (40 mg total) by mouth daily.   gabapentin 800 MG tablet Commonly known as: NEURONTIN Take 800 mg by  mouth 2 (two) times daily.   metoprolol succinate 50 MG 24 hr tablet Commonly known as: TOPROL-XL Take 1 tablet (50 mg total) by mouth 2 (two) times daily. Take with or immediately following a meal.   nitroGLYCERIN 0.4 MG SL tablet Commonly known as: NITROSTAT Place 1 tablet (0.4 mg total) under the tongue every 5 (five) minutes as needed for chest pain.   Oxycodone HCl 10 MG Tabs Take 10 mg by mouth every 6 (six) hours as needed (pain).   tiZANidine 4 MG tablet Commonly known as: ZANAFLEX Take 4 mg by mouth every 8 (eight) hours as needed for muscle spasms.   VITAMIN B-12 PO Take 1 tablet by mouth daily.   VITAMIN E PO Take 1 capsule by mouth in the morning and at bedtime.        Follow-up Information     Varadarajan, Rupashree Follow up in 2 week(s).                 Allergies  Allergen Reactions   Penicillins Other (See Comments)    Has not had medication but lists it; states able to take amoxicillin without any problems   Codeine Rash    Can tolerate Percocet    Consultations: None   Procedures/Studies: DG Chest 2 View  Result Date: 03/25/2021 CLINICAL DATA:  Shortness of breath EXAM: CHEST - 2 VIEW COMPARISON:  Aug 16, 2020 FINDINGS: The heart size and mediastinal contours are within normal limits. Low lung volumes with bibasilar opacities favor atelectasis. No visible pleural effusion or pneumothorax. Left upper quadrant surgical clips. IMPRESSION: Low lung volumes with bibasilar opacities favor atelectasis although infiltrate not excluded. Electronically Signed   By: Maudry Mayhew M.D.   On: 03/25/2021 19:15   CT Angio Chest PE W and/or Wo Contrast  Addendum Date: 03/26/2021   ADDENDUM REPORT: 03/26/2021 01:32 ADDENDUM: Critical findings were reported to Dr. Blinda Leatherwood at 1:31 a.m. Electronically Signed   By: Thornell Sartorius M.D.   On: 03/26/2021 01:32   Result Date: 03/26/2021 CLINICAL DATA:  Shortness of breath, left rib pain. PE suspected, high  probability. EXAM: CT ANGIOGRAPHY CHEST WITH CONTRAST TECHNIQUE: Multidetector CT imaging of the chest was performed using the standard protocol during bolus administration of intravenous contrast. Multiplanar CT image reconstructions and MIPs were obtained to evaluate the vascular anatomy. CONTRAST:  75mL OMNIPAQUE IOHEXOL 350 MG/ML SOLN COMPARISON:  05/03/2020. FINDINGS: Cardiovascular: The heart is normal in size and there is no pericardial effusion. The aorta is normal in caliber. The pulmonary trunk is mildly distended which may be associated with underlying pulmonary artery hypertension. Pulmonary artery filling defects are identified in the right pulmonary trunk extending into the right upper, middle, and lower lobes. Segmental pulmonary artery filling defects are identified in the left upper and lower lobes. The right ventricle is mildly distended, suggesting underlying pulmonary artery hypertension. Mediastinum/Nodes: Shotty lymph nodes are present in the mediastinum and  left hilum. No axillary lymphadenopathy. The thyroid gland and trachea are within normal limits. There is a small hiatal hernia with a small amount of fluid in the distal esophagus, possible reflux. Lungs/Pleura: Strandy opacities are present at the lung bases. There is a small left pleural effusion. No pneumothorax. Upper Abdomen: Gastric surgery changes are noted. No acute abnormality. Musculoskeletal: No acute fracture. Review of the MIP images confirms the above findings. IMPRESSION: 1. Bilateral pulmonary emboli. The right ventricle is distended suggesting right heart strain. 2. Mildly distended pulmonary trunk, which may be associated with underlying pulmonary artery hypertension. 3. Strandy opacities at the lung bases, possible atelectasis or infiltrate. 4. Small left pleural effusion. 5. Small hiatal hernia with fluid in the distal esophagus, possible reflux. Electronically Signed: By: Thornell Sartorius M.D. On: 03/26/2021 01:28    ECHOCARDIOGRAM COMPLETE  Result Date: 03/26/2021    ECHOCARDIOGRAM REPORT   Patient Name:   [name] [name] Date of Exam: 03/26/2021 Medical Rec #:  [id number]                  Height:       64.5 in Accession #:    [id number]                 Weight:       215.0 lb Date of Birth:  Aug 21, 1960                  BSA:          2.028 m Patient Age:    60 years                   BP:           145/94 mmHg Patient Gender: F                          HR:           79 bpm. Exam Location:  Inpatient Procedure: 2D Echo, Cardiac Doppler and Color Doppler Indications:    Pulmonary Embolus  History:        Patient has prior history of Echocardiogram examinations, most                 recent 03/05/2018. CHF, Arrythmias:Tachycardia; Risk                 Factors:Hypertension. COVID +.  Sonographer:    Mikki Harbor Referring Phys: [phone] Eduard Clos  Sonographer Comments: Patient is morbidly obese. IMPRESSIONS  1. Left ventricular ejection fraction, by estimation, is 60 to 65%. The left ventricle has normal function. The left ventricle has no regional wall motion abnormalities. Left ventricular diastolic parameters are consistent with Grade I diastolic dysfunction (impaired relaxation).  2. Right ventricular systolic function is normal. The right ventricular size is normal. There is mildly elevated pulmonary artery systolic pressure.  3. The mitral valve is normal in structure. No evidence of mitral valve regurgitation. No evidence of mitral stenosis.  4. The aortic valve is normal in structure. Aortic valve regurgitation is not visualized. No aortic stenosis is present.  5. The inferior vena cava is normal in size with greater than 50% respiratory variability, suggesting right atrial pressure of 3 mmHg. FINDINGS  Left Ventricle: Left ventricular ejection fraction, by estimation, is 60 to 65%. The left ventricle has normal function. The left ventricle has no regional wall motion abnormalities. The left  ventricular internal cavity size was  normal in size. There is  no left ventricular hypertrophy. Left ventricular diastolic parameters are consistent with Grade I diastolic dysfunction (impaired relaxation). Normal left ventricular filling pressure. Right Ventricle: The right ventricular size is normal. No increase in right ventricular wall thickness. Right ventricular systolic function is normal. There is mildly elevated pulmonary artery systolic pressure. The tricuspid regurgitant velocity is 2.88  m/s, and with an assumed right atrial pressure of 3 mmHg, the estimated right ventricular systolic pressure is 36.2 mmHg. Left Atrium: Left atrial size was normal in size. Right Atrium: Right atrial size was normal in size. Pericardium: There is no evidence of pericardial effusion. Mitral Valve: The mitral valve is normal in structure. No evidence of mitral valve regurgitation. No evidence of mitral valve stenosis. MV peak gradient, 6.2 mmHg. The mean mitral valve gradient is 2.0 mmHg. Tricuspid Valve: The tricuspid valve is normal in structure. Tricuspid valve regurgitation is mild . No evidence of tricuspid stenosis. Aortic Valve: The aortic valve is normal in structure. Aortic valve regurgitation is not visualized. No aortic stenosis is present. Aortic valve mean gradient measures 4.0 mmHg. Aortic valve peak gradient measures 8.6 mmHg. Aortic valve area, by VTI measures 2.09 cm. Pulmonic Valve: The pulmonic valve was normal in structure. Pulmonic valve regurgitation is not visualized. No evidence of pulmonic stenosis. Aorta: The aortic root is normal in size and structure. Venous: The inferior vena cava is normal in size with greater than 50% respiratory variability, suggesting right atrial pressure of 3 mmHg. IAS/Shunts: No atrial level shunt detected by color flow Doppler.  LEFT VENTRICLE PLAX 2D LVIDd:         4.40 cm     Diastology LVIDs:         2.90 cm     LV e' medial:    7.45 cm/s LV PW:         1.10 cm      LV E/e' medial:  9.0 LV IVS:        1.00 cm     LV e' lateral:   9.02 cm/s LVOT diam:     2.00 cm     LV E/e' lateral: 7.4 LV SV:         63 LV SV Index:   31 LVOT Area:     3.14 cm  LV Volumes (MOD) LV vol d, MOD A2C: 83.3 ml LV vol d, MOD A4C: 91.8 ml LV vol s, MOD A2C: 31.0 ml LV vol s, MOD A4C: 37.8 ml LV SV MOD A2C:     52.3 ml LV SV MOD A4C:     91.8 ml LV SV MOD BP:      53.7 ml RIGHT VENTRICLE RV Basal diam:  3.20 cm RV Mid diam:    2.80 cm RV S prime:     14.30 cm/s LEFT ATRIUM             Index        RIGHT ATRIUM           Index LA diam:        3.40 cm 1.68 cm/m   RA Area:     15.00 cm LA Vol (A2C):   65.6 ml 32.34 ml/m  RA Volume:   33.30 ml  16.42 ml/m LA Vol (A4C):   34.1 ml 16.81 ml/m LA Biplane Vol: 47.4 ml 23.37 ml/m  AORTIC VALVE  PULMONIC VALVE AV Area (Vmax):    2.07 cm     PV Vmax:       0.72 m/s AV Area (Vmean):   1.94 cm     PV Peak grad:  2.1 mmHg AV Area (VTI):     2.09 cm AV Vmax:           147.00 cm/s AV Vmean:          94.400 cm/s AV VTI:            0.301 m AV Peak Grad:      8.6 mmHg AV Mean Grad:      4.0 mmHg LVOT Vmax:         97.00 cm/s LVOT Vmean:        58.300 cm/s LVOT VTI:          0.200 m LVOT/AV VTI ratio: 0.66  AORTA Ao Root diam: 3.10 cm MITRAL VALVE                TRICUSPID VALVE MV Area (PHT): 3.12 cm     TR Peak grad:   33.2 mmHg MV Area VTI:   1.92 cm     TR Vmax:        288.00 cm/s MV Peak grad:  6.2 mmHg MV Mean grad:  2.0 mmHg     SHUNTS MV Vmax:       1.24 m/s     Systemic VTI:  0.20 m MV Vmean:      58.9 cm/s    Systemic Diam: 2.00 cm MV Decel Time: 243 msec MV E velocity: 66.90 cm/s MV A velocity: 113.00 cm/s MV E/A ratio:  0.59 Mihai Croitoru MD Electronically signed by Thurmon Fair MD Signature Date/Time: 03/26/2021/1:38:29 PM    Final    VAS [place] LOWER EXTREMITY VENOUS (DVT)  Result Date: 03/26/2021  Lower Venous DVT Study Patient Name:  ANAM BOBBY  Date of Exam:   03/26/2021 Medical Rec #: [id number]                    Accession #:    [id number] Date of Birth: 08/09/1960                   Patient Gender: F Patient Age:   59 years Exam Location:  [facility] Procedure:      VAS [place] LOWER EXTREMITY VENOUS (DVT) Referring Phys: Midge Minium --------------------------------------------------------------------------------  Indications: Pulmonary embolism, and Covid-19.  Comparison Study: No prior study on file Performing Technologist: Sherren Kerns RVS  Examination Guidelines: A complete evaluation includes B-mode imaging, spectral Doppler, color Doppler, and power Doppler as needed of all accessible portions of each vessel. Bilateral testing is considered an integral part of a complete examination. Limited examinations for reoccurring indications may be performed as noted. The reflux portion of the exam is performed with the patient in reverse Trendelenburg.  +---------+---------------+---------+-----------+----------+--------------+  RIGHT     Compressibility Phasicity Spontaneity Properties Thrombus Aging  +---------+---------------+---------+-----------+----------+--------------+  CFV       Full            Yes       Yes                                    +---------+---------------+---------+-----------+----------+--------------+  SFJ       Full                                                             +---------+---------------+---------+-----------+----------+--------------+  FV Prox   Full                                                             +---------+---------------+---------+-----------+----------+--------------+  FV Mid    Full                                                             +---------+---------------+---------+-----------+----------+--------------+  FV Distal Full                                                             +---------+---------------+---------+-----------+----------+--------------+  PFV       Full                                                              +---------+---------------+---------+-----------+----------+--------------+  POP       Full            Yes       Yes                                    +---------+---------------+---------+-----------+----------+--------------+  PTV       Full                                                             +---------+---------------+---------+-----------+----------+--------------+  PERO      Full                                                             +---------+---------------+---------+-----------+----------+--------------+   +---------+---------------+---------+-----------+----------+--------------+  LEFT      Compressibility Phasicity Spontaneity Properties Thrombus Aging  +---------+---------------+---------+-----------+----------+--------------+  CFV       Full            Yes       Yes                                    +---------+---------------+---------+-----------+----------+--------------+  SFJ       Full                                                             +---------+---------------+---------+-----------+----------+--------------+  FV Prox   Full                                                             +---------+---------------+---------+-----------+----------+--------------+  FV Mid    Full                                                             +---------+---------------+---------+-----------+----------+--------------+  FV Distal Full                                                             +---------+---------------+---------+-----------+----------+--------------+  PFV       Full                                                             +---------+---------------+---------+-----------+----------+--------------+  POP       Full            Yes       Yes                                    +---------+---------------+---------+-----------+----------+--------------+  PTV       Full                                                              +---------+---------------+---------+-----------+----------+--------------+  PERO      Full                                                             +---------+---------------+---------+-----------+----------+--------------+     Summary: BILATERAL: - No evidence of deep vein thrombosis seen in the lower extremities, bilaterally. -No evidence of popliteal cyst, bilaterally.   *See table(s) above for measurements and observations. Electronically signed by Heath Lark on 03/26/2021 at 1:07:20 PM.    Final    (Echo, Carotid, EGD, Colonoscopy, ERCP)    Subjective: Patient seen and examined.  No overnight events.  Early morning rounds, she was eager to go home.  Walked around and did not have any trouble breathing.  Multiple questions answered regarding anticoagulation.   Discharge Exam: Vitals:   03/28/21 0400 03/28/21 0700  BP:    Pulse:  67  Resp: 20 20  Temp:    SpO2:  100%  Vitals:   03/28/21 0200 03/28/21 0335 03/28/21 0400 03/28/21 0700  BP:  114/66    Pulse:  65  67  Resp: 20 17 20 20   Temp:  98.6 F (37 C)    TempSrc:  Oral    SpO2:  95%  100%  Weight:  92.8 kg    Height:        General: Pt is alert, awake, not in acute distress Cardiovascular: RRR, S1/S2 +, no rubs, no gallops Respiratory: CTA bilaterally, no wheezing, no rhonchi Abdominal: Soft, NT, ND, bowel sounds + Extremities: no edema, no cyanosis    The results of significant diagnostics from this hospitalization (including imaging, microbiology, ancillary and laboratory) are listed below for reference.     Microbiology: Recent Results (from the past 240 hour(s))  Resp Panel by RT-PCR (Flu A&B, Covid) Nasopharyngeal Swab     Status: Abnormal   Collection Time: 03/25/21  6:53 PM   Specimen: Nasopharyngeal Swab; Nasopharyngeal(NP) swabs in vial transport medium  Result Value Ref Range Status   SARS Coronavirus 2 by RT PCR POSITIVE (A) NEGATIVE Final    Comment: (NOTE) SARS-CoV-2 target nucleic acids are  DETECTED.  The SARS-CoV-2 RNA is generally detectable in upper respiratory specimens during the acute phase of infection. Positive results are indicative of the presence of the identified virus, but do not rule out bacterial infection or co-infection with other pathogens not detected by the test. Clinical correlation with patient history and other diagnostic information is necessary to determine patient infection status. The expected result is Negative.  Fact Sheet for Patients: BloggerCourse.com  Fact Sheet for Healthcare Providers: SeriousBroker.it  This test is not yet approved or cleared by the [place] FDA and  has been authorized for detection and/or diagnosis of SARS-CoV-2 by FDA under an Emergency Use Authorization (EUA).  This EUA will remain in effect (meaning this test can be used) for the duration of  the COVID-19 declaration under Section 564(b)(1) of the A ct, 21 U.S.C. section 360bbb-3(b)(1), unless the authorization is terminated or revoked sooner.     Influenza A by PCR NEGATIVE NEGATIVE Final   Influenza B by PCR NEGATIVE NEGATIVE Final    Comment: (NOTE) The Xpert Xpress SARS-CoV-2/FLU/RSV plus assay is intended as an aid in the diagnosis of influenza from Nasopharyngeal swab specimens and should not be used as a sole basis for treatment. Nasal washings and aspirates are unacceptable for Xpert Xpress SARS-CoV-2/FLU/RSV testing.  Fact Sheet for Patients: BloggerCourse.com  Fact Sheet for Healthcare Providers: SeriousBroker.it  This test is not yet approved or cleared by the [place] FDA and has been authorized for detection and/or diagnosis of SARS-CoV-2 by FDA under an Emergency Use Authorization (EUA). This EUA will remain in effect (meaning this test can be used) for the duration of the COVID-19 declaration under Section 564(b)(1) of the Act, 21  U.S.C. section 360bbb-3(b)(1), unless the authorization is terminated or revoked.  Performed at [facility], [address], [place], [place] [address]      Labs: BNP (last 3 results) No results for input(s): BNP in the last 8760 hours. Basic Metabolic Panel: Recent Labs  Lab 03/25/21 1853 03/27/21 0246 03/28/21 0116  NA 137 141 140  K 3.8 3.8 4.0  CL 102 108 109  CO2 25 25 24   GLUCOSE 85 104* 105*  BUN 6* 8 9  CREATININE 0.91 0.87 0.85  CALCIUM 9.1 8.7* 8.8*   Liver Function Tests: Recent Labs  Lab 03/25/21 1853  03/27/21 0246 03/28/21 0116  AST ALT ALKPHOS 87 76 69  BILITOT 0.8 0.8 0.7  PROT 7.7 6.4* 6.2*  ALBUMIN 3.9 3.0* 3.1*   No results for input(s): LIPASE, AMYLASE in the last 168 hours. No results for input(s): AMMONIA in the last 168 hours. CBC: Recent Labs  Lab 03/25/21 1853 03/27/21 0246 03/28/21 0116  WBC 8.7 6.7 6.5  NEUTROABS 5.8 4.2 4.3  HGB 15.8* 14.5 14.4  HCT 47.7* 43.5 42.8  MCV 86.3 85.5 85.3  PLT 224 206 213   Cardiac Enzymes: No results for input(s): CKTOTAL, CKMB, CKMBINDEX, TROPONINI in the last 168 hours. BNP: Invalid input(s): POCBNP CBG: No results for input(s): GLUCAP in the last 168 hours. D-Dimer Recent Labs    03/27/21 0246 03/28/21 0116  DDIMER 2.47* 2.01*   Hgb A1c No results for input(s): HGBA1C in the last 72 hours. Lipid Profile No results for input(s): CHOL, HDL, LDLCALC, TRIG, CHOLHDL, LDLDIRECT in the last 72 hours. Thyroid function studies No results for input(s): TSH, T4TOTAL, T3FREE, THYROIDAB in the last 72 hours.  Invalid input(s): FREET3 Anemia work up No results for input(s): VITAMINB12, FOLATE, FERRITIN, TIBC, IRON, RETICCTPCT in the last 72 hours. Urinalysis    Component Value Date/Time   COLORURINE YELLOW 07/03/2017 1149   APPEARANCEUR CLEAR 07/03/2017 1149   LABSPEC 1.011 07/03/2017 1149   PHURINE 5.0 07/03/2017 1149   GLUCOSEU NEGATIVE 07/03/2017 1149    HGBUR NEGATIVE 07/03/2017 1149   BILIRUBINUR NEGATIVE 07/03/2017 1149   KETONESUR NEGATIVE 07/03/2017 1149   PROTEINUR NEGATIVE 07/03/2017 1149   UROBILINOGEN 0.2 07/18/2014 2219   NITRITE NEGATIVE 07/03/2017 1149   LEUKOCYTESUR NEGATIVE 07/03/2017 1149   Sepsis Labs Invalid input(s): PROCALCITONIN,  WBC,  LACTICIDVEN Microbiology Recent Results (from the past 240 hour(s))  Resp Panel by RT-PCR (Flu A&B, Covid) Nasopharyngeal Swab     Status: Abnormal   Collection Time: 03/25/21  6:53 PM   Specimen: Nasopharyngeal Swab; Nasopharyngeal(NP) swabs in vial transport medium  Result Value Ref Range Status   SARS Coronavirus 2 by RT PCR POSITIVE (A) NEGATIVE Final    Comment: (NOTE) SARS-CoV-2 target nucleic acids are DETECTED.  The SARS-CoV-2 RNA is generally detectable in upper respiratory specimens during the acute phase of infection. Positive results are indicative of the presence of the identified virus, but do not rule out bacterial infection or co-infection with other pathogens not detected by the test. Clinical correlation with patient history and other diagnostic information is necessary to determine patient infection status. The expected result is Negative.  Fact Sheet for Patients: BloggerCourse.com  Fact Sheet for Healthcare Providers: SeriousBroker.it  This test is not yet approved or cleared by the [place] FDA and  has been authorized for detection and/or diagnosis of SARS-CoV-2 by FDA under an Emergency Use Authorization (EUA).  This EUA will remain in effect (meaning this test can be used) for the duration of  the COVID-19 declaration under Section 564(b)(1) of the A ct, 21 U.S.C. section 360bbb-3(b)(1), unless the authorization is terminated or revoked sooner.     Influenza A by PCR NEGATIVE NEGATIVE Final   Influenza B by PCR NEGATIVE NEGATIVE Final    Comment: (NOTE) The Xpert Xpress SARS-CoV-2/FLU/RSV  plus assay is intended as an aid in the diagnosis of influenza from Nasopharyngeal swab specimens and should not be used as a sole basis for treatment. Nasal washings and aspirates are unacceptable for Xpert Xpress SARS-CoV-2/FLU/RSV testing.  Fact Sheet for  Patients: BloggerCourse.com  Fact Sheet for Healthcare Providers: SeriousBroker.it  This test is not yet approved or cleared by the [place] FDA and has been authorized for detection and/or diagnosis of SARS-CoV-2 by FDA under an Emergency Use Authorization (EUA). This EUA will remain in effect (meaning this test can be used) for the duration of the COVID-19 declaration under Section 564(b)(1) of the Act, 21 U.S.C. section 360bbb-3(b)(1), unless the authorization is terminated or revoked.  Performed at [facility], [address], [place], [place] [address]      Time coordinating discharge:  35 minutes  SIGNED:   Dorcas Carrow, MD  [facility] 03/28/2021, 8:15 AM

## 2021-03-28 NOTE — Progress Notes (Signed)
Patient is irritable and refusing discharge education.

## 2021-03-28 NOTE — Progress Notes (Signed)
Patient being discharged home.  Patient to be transported by her husband.  Discharge instructions and prescription information given to the patient who verbalized "I already know what I need to know.".

## 2021-03-28 NOTE — Discharge Instructions (Signed)
Information on my medicine - ELIQUIS (apixaban)  This medication education was reviewed with me or my healthcare representative as part of my discharge preparation.  The pharmacist that spoke with me during my hospital stay was:  Mosetta Anis, [facility]  Why was Eliquis prescribed for you? Eliquis was prescribed to treat blood clots that may have been found in the veins of your legs (deep vein thrombosis) or in your lungs (pulmonary embolism) and to reduce the risk of them occurring again.  What do You need to know about Eliquis ? The starting dose is 10 mg (two 5 mg tablets) taken TWICE daily for the FIRST SEVEN (7) DAYS, then on (enter date)  04/03/20  the dose is reduced to ONE 5 mg tablet taken TWICE daily.  Eliquis may be taken with or without food.   Try to take the dose about the same time in the morning and in the evening. If you have difficulty swallowing the tablet whole please discuss with your pharmacist how to take the medication safely.  Take Eliquis exactly as prescribed and DO NOT stop taking Eliquis without talking to the doctor who prescribed the medication.  Stopping may increase your risk of developing a new blood clot.  Refill your prescription before you run out.  After discharge, you should have regular check-up appointments with your healthcare provider that is prescribing your Eliquis.    What do you do if you miss a dose? If a dose of ELIQUIS is not taken at the scheduled time, take it as soon as possible on the same day and twice-daily administration should be resumed. The dose should not be doubled to make up for a missed dose.  Important Safety Information A possible side effect of Eliquis is bleeding. You should call your healthcare provider right away if you experience any of the following: Bleeding from an injury or your nose that does not stop. Unusual colored urine (red or dark brown) or unusual colored stools (red or black). Unusual bruising for  unknown reasons. A serious fall or if you hit your head (even if there is no bleeding).  Some medicines may interact with Eliquis and might increase your risk of bleeding or clotting while on Eliquis. To help avoid this, consult your healthcare provider or pharmacist prior to using any new prescription or non-prescription medications, including herbals, vitamins, non-steroidal anti-inflammatory drugs (NSAIDs) and supplements.  This website has more information on Eliquis (apixaban): http://www.eliquis.com/eliquis/home

## 2021-03-28 NOTE — Progress Notes (Signed)
IV's removed with the catheters intact

## 2021-04-03 ENCOUNTER — Ambulatory Visit: Payer: Medicare Other | Admitting: Podiatry

## 2021-04-09 DIAGNOSIS — H43812 Vitreous degeneration, left eye: Secondary | ICD-10-CM | POA: Diagnosis not present

## 2021-04-09 DIAGNOSIS — H40013 Open angle with borderline findings, low risk, bilateral: Secondary | ICD-10-CM | POA: Diagnosis not present

## 2021-04-09 DIAGNOSIS — H10413 Chronic giant papillary conjunctivitis, bilateral: Secondary | ICD-10-CM | POA: Diagnosis not present

## 2021-04-09 DIAGNOSIS — H2513 Age-related nuclear cataract, bilateral: Secondary | ICD-10-CM | POA: Diagnosis not present

## 2021-04-10 DIAGNOSIS — M5412 Radiculopathy, cervical region: Secondary | ICD-10-CM | POA: Diagnosis not present

## 2021-04-10 DIAGNOSIS — Z79891 Long term (current) use of opiate analgesic: Secondary | ICD-10-CM | POA: Diagnosis not present

## 2021-04-10 DIAGNOSIS — G894 Chronic pain syndrome: Secondary | ICD-10-CM | POA: Diagnosis not present

## 2021-04-10 DIAGNOSIS — M542 Cervicalgia: Secondary | ICD-10-CM | POA: Diagnosis not present

## 2021-04-10 DIAGNOSIS — M545 Low back pain, unspecified: Secondary | ICD-10-CM | POA: Diagnosis not present

## 2021-04-10 DIAGNOSIS — G8929 Other chronic pain: Secondary | ICD-10-CM | POA: Diagnosis not present

## 2021-04-10 DIAGNOSIS — M5136 Other intervertebral disc degeneration, lumbar region: Secondary | ICD-10-CM | POA: Diagnosis not present

## 2021-04-17 ENCOUNTER — Other Ambulatory Visit: Payer: Self-pay | Admitting: Internal Medicine

## 2021-04-17 ENCOUNTER — Ambulatory Visit: Payer: Medicare Other | Admitting: Podiatry

## 2021-04-17 DIAGNOSIS — Z1231 Encounter for screening mammogram for malignant neoplasm of breast: Secondary | ICD-10-CM

## 2021-04-18 ENCOUNTER — Ambulatory Visit
Admission: RE | Admit: 2021-04-18 | Discharge: 2021-04-18 | Disposition: A | Discharge status: Home / Self Care | Payer: Medicare Other | Source: Ambulatory Visit | Attending: Internal Medicine | Admitting: Internal Medicine

## 2021-04-18 DIAGNOSIS — D6859 Other primary thrombophilia: Secondary | ICD-10-CM | POA: Diagnosis not present

## 2021-04-18 DIAGNOSIS — Z1231 Encounter for screening mammogram for malignant neoplasm of breast: Secondary | ICD-10-CM | POA: Diagnosis not present

## 2021-04-18 DIAGNOSIS — I471 Supraventricular tachycardia: Secondary | ICD-10-CM | POA: Diagnosis not present

## 2021-04-18 DIAGNOSIS — Z8616 Personal history of COVID-19: Secondary | ICD-10-CM | POA: Diagnosis not present

## 2021-04-18 DIAGNOSIS — I2699 Other pulmonary embolism without acute cor pulmonale: Secondary | ICD-10-CM | POA: Diagnosis not present

## 2021-04-18 DIAGNOSIS — I10 Essential (primary) hypertension: Secondary | ICD-10-CM | POA: Diagnosis not present

## 2021-05-04 ENCOUNTER — Ambulatory Visit (INDEPENDENT_AMBULATORY_CARE_PROVIDER_SITE_OTHER): Payer: Medicare Other | Admitting: Pulmonary Disease

## 2021-05-04 ENCOUNTER — Encounter: Payer: Self-pay | Admitting: Pulmonary Disease

## 2021-05-04 ENCOUNTER — Ambulatory Visit: Payer: Medicare Other | Admitting: Podiatry

## 2021-05-04 ENCOUNTER — Other Ambulatory Visit: Payer: Self-pay

## 2021-05-04 VITALS — BP 124/66 | HR 78 | Temp 98.1°F | Ht 65.0 in | Wt 205.0 lb

## 2021-05-04 DIAGNOSIS — I2609 Other pulmonary embolism with acute cor pulmonale: Secondary | ICD-10-CM

## 2021-05-04 NOTE — Patient Instructions (Signed)
Continue the Eliquis anticoagulation Return to clinic in 5 months.
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
## 2021-05-07 DIAGNOSIS — Z23 Encounter for immunization: Secondary | ICD-10-CM | POA: Diagnosis not present

## 2021-05-11 DIAGNOSIS — M545 Low back pain, unspecified: Secondary | ICD-10-CM | POA: Diagnosis not present

## 2021-05-11 DIAGNOSIS — G894 Chronic pain syndrome: Secondary | ICD-10-CM | POA: Diagnosis not present

## 2021-05-11 DIAGNOSIS — G8929 Other chronic pain: Secondary | ICD-10-CM | POA: Diagnosis not present

## 2021-05-11 DIAGNOSIS — M542 Cervicalgia: Secondary | ICD-10-CM | POA: Diagnosis not present

## 2021-05-11 DIAGNOSIS — Z79891 Long term (current) use of opiate analgesic: Secondary | ICD-10-CM | POA: Diagnosis not present

## 2021-05-11 DIAGNOSIS — M5412 Radiculopathy, cervical region: Secondary | ICD-10-CM | POA: Diagnosis not present

## 2021-05-11 DIAGNOSIS — M5136 Other intervertebral disc degeneration, lumbar region: Secondary | ICD-10-CM | POA: Diagnosis not present

## 2021-05-15 ENCOUNTER — Other Ambulatory Visit: Payer: Self-pay

## 2021-05-15 ENCOUNTER — Encounter: Payer: Self-pay | Admitting: Podiatry

## 2021-05-15 ENCOUNTER — Ambulatory Visit (INDEPENDENT_AMBULATORY_CARE_PROVIDER_SITE_OTHER): Payer: Medicare Other | Admitting: Podiatry

## 2021-05-15 DIAGNOSIS — Z7901 Long term (current) use of anticoagulants: Secondary | ICD-10-CM | POA: Insufficient documentation

## 2021-05-15 DIAGNOSIS — Z9889 Other specified postprocedural states: Secondary | ICD-10-CM

## 2021-05-15 DIAGNOSIS — M79609 Pain in unspecified limb: Secondary | ICD-10-CM | POA: Diagnosis not present

## 2021-05-15 DIAGNOSIS — B351 Tinea unguium: Secondary | ICD-10-CM

## 2021-05-15 DIAGNOSIS — M2042 Other hammer toe(s) (acquired), left foot: Secondary | ICD-10-CM

## 2021-05-15 DIAGNOSIS — D6859 Other primary thrombophilia: Secondary | ICD-10-CM | POA: Insufficient documentation

## 2021-05-15 DIAGNOSIS — Z8616 Personal history of COVID-19: Secondary | ICD-10-CM | POA: Insufficient documentation

## 2021-05-15 DIAGNOSIS — M722 Plantar fascial fibromatosis: Secondary | ICD-10-CM

## 2021-05-15 NOTE — Progress Notes (Signed)
°  Subjective:  Patient ID: [name] [name], female    DOB: 04/02/1960,  MRN: [id number]  Chief Complaint  Patient presents with   Nail Problem    I need the 4 toenails on the left foot trimmed    Plantar Fasciitis    The left heel is doing better and the shot and brace did help and does not hurt when I get up    61 y.o. female presents with the above complaint. History confirmed with patient. States the toenails on the left foot are thickeend and painful and she cannot cut them herself.  Objective:  Physical Exam: warm, good capillary refill, no trophic changes or ulcerative lesions, normal DP and PT pulses, and normal sensory exam. Nails left foot dystrophic, thickened with POP. Left Foot: no pain with calcaneal squeeze, no pain medial calcaneus.  Assessment:   1. Plantar fasciitis   2. Hammer toe of left foot   3. Status post surgery     Plan:  Patient was evaluated and treated and all questions answered.  Plantar Fasciitis -Resolved, no need for repeat injection today  Onychomycosis -Nails x5 debrided left  Procedure: Nail Debridement Type of Debridement: manual, sharp debridement. Instrumentation: Nail nipper, rotary burr. Number of Nails: 5 Disposition: Patient tolerated well without iatrogenic injury.
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
## 2021-05-18 ENCOUNTER — Ambulatory Visit (INDEPENDENT_AMBULATORY_CARE_PROVIDER_SITE_OTHER): Payer: Medicare Other | Admitting: General Practice

## 2021-05-18 ENCOUNTER — Other Ambulatory Visit: Payer: Self-pay

## 2021-05-18 ENCOUNTER — Encounter (HOSPITAL_BASED_OUTPATIENT_CLINIC_OR_DEPARTMENT_OTHER): Payer: Self-pay | Admitting: General Practice

## 2021-05-18 VITALS — BP 124/82 | HR 74 | Ht 65.0 in | Wt 218.5 lb

## 2021-05-18 DIAGNOSIS — I10 Essential (primary) hypertension: Secondary | ICD-10-CM

## 2021-05-18 DIAGNOSIS — E785 Hyperlipidemia, unspecified: Secondary | ICD-10-CM

## 2021-05-18 DIAGNOSIS — I2609 Other pulmonary embolism with acute cor pulmonale: Secondary | ICD-10-CM | POA: Diagnosis not present

## 2021-05-18 DIAGNOSIS — I5032 Chronic diastolic (congestive) heart failure: Secondary | ICD-10-CM

## 2021-05-18 DIAGNOSIS — R0609 Other forms of dyspnea: Secondary | ICD-10-CM

## 2021-05-18 DIAGNOSIS — R42 Dizziness and giddiness: Secondary | ICD-10-CM

## 2021-05-18 DIAGNOSIS — I471 Supraventricular tachycardia: Secondary | ICD-10-CM

## 2021-05-18 MED ORDER — APIXABAN 5 MG PO TABS: 5.0000 mg | ORAL_TABLET | Freq: Two times a day (BID) | ORAL | 3 refills | Status: DC

## 2021-05-18 MED ORDER — FUROSEMIDE 40 MG PO TABS: 40.0000 mg | ORAL_TABLET | Freq: Every day | ORAL | 3 refills | Status: AC

## 2021-05-18 MED ORDER — METOPROLOL SUCCINATE ER 50 MG PO TB24: 50.0000 mg | ORAL_TABLET | Freq: Two times a day (BID) | ORAL | 3 refills | Status: DC

## 2021-05-18 NOTE — Patient Instructions (Addendum)
Medication Instructions:  Your Physician recommend you continue on your current medication as directed.    We have sent in refills for Eliquis, Metoprolol, and Furosemide!   *If you need a refill on your cardiac medications before your next appointment, please call your pharmacy*   Lab Work: None ordered today   Testing/Procedures: None ordered today    Follow-Up: At [facility], you and your health needs are our priority.  As part of our continuing mission to provide you with exceptional heart care, we have created designated Provider Care Teams.  These Care Teams include your primary Cardiologist (physician) and Advanced Practice Providers (APPs -  Physician Assistants and Nurse Practitioners) who all work together to provide you with the care you need, when you need it.  We recommend signing up for the patient portal called "MyChart".  Sign up information is provided on this After Visit Summary.  MyChart is used to connect with patients for Virtual Visits (Telemedicine).  Patients are able to view lab/test results, encounter notes, upcoming appointments, etc.  Non-urgent messages can be sent to your provider as well.   To learn more about what you can do with MyChart, go to [email or web address].    Your next appointment:   4 month(s)  The format for your next appointment:   In Person  Provider:   Dr. Shari Prows or Edd Fabian, NP   Other Instructions Exercise recommendations: The American Heart Association recommends 150 minutes of moderate intensity exercise weekly. Try 30 minutes of moderate intensity exercise 4-5 times per week. This could include walking, jogging, or swimming.   To prevent palpitations: Make sure you are adequately hydrated.  Avoid and/or limit caffeine containing beverages like soda or tea. Exercise regularly.  Manage stress well. Some over the counter medications can cause palpitations such as Benadryl, AdvilPM, TylenolPM. Regular Advil or  Tylenol do not cause palpitations.

## 2021-05-24 DIAGNOSIS — Z1231 Encounter for screening mammogram for malignant neoplasm of breast: Secondary | ICD-10-CM | POA: Diagnosis not present

## 2021-05-24 DIAGNOSIS — I2699 Other pulmonary embolism without acute cor pulmonale: Secondary | ICD-10-CM | POA: Diagnosis not present

## 2021-05-24 DIAGNOSIS — Z0000 Encounter for general adult medical examination without abnormal findings: Secondary | ICD-10-CM | POA: Diagnosis not present

## 2021-05-24 DIAGNOSIS — I10 Essential (primary) hypertension: Secondary | ICD-10-CM | POA: Diagnosis not present

## 2021-05-24 DIAGNOSIS — I471 Supraventricular tachycardia: Secondary | ICD-10-CM | POA: Diagnosis not present

## 2021-05-24 DIAGNOSIS — D6859 Other primary thrombophilia: Secondary | ICD-10-CM | POA: Diagnosis not present

## 2021-05-24 DIAGNOSIS — Z1211 Encounter for screening for malignant neoplasm of colon: Secondary | ICD-10-CM | POA: Diagnosis not present

## 2021-05-24 DIAGNOSIS — M109 Gout, unspecified: Secondary | ICD-10-CM | POA: Diagnosis not present

## 2021-05-24 DIAGNOSIS — Z1159 Encounter for screening for other viral diseases: Secondary | ICD-10-CM | POA: Diagnosis not present

## 2021-05-24 DIAGNOSIS — Z1389 Encounter for screening for other disorder: Secondary | ICD-10-CM | POA: Diagnosis not present

## 2021-05-25 ENCOUNTER — Telehealth: Payer: Self-pay | Admitting: Pulmonary Disease

## 2021-05-25 NOTE — Telephone Encounter (Signed)
Called patient and verified that they wanted paperwork sent to docotrs office. Called doctors office to obtain fax number. Will send fax to fax number that was provided. Nothing further

## 2021-05-28 ENCOUNTER — Other Ambulatory Visit: Payer: Self-pay | Admitting: Internal Medicine

## 2021-05-28 DIAGNOSIS — E2839 Other primary ovarian failure: Secondary | ICD-10-CM

## 2021-06-08 DIAGNOSIS — G894 Chronic pain syndrome: Secondary | ICD-10-CM | POA: Diagnosis not present

## 2021-06-08 DIAGNOSIS — Z79891 Long term (current) use of opiate analgesic: Secondary | ICD-10-CM | POA: Diagnosis not present

## 2021-06-08 DIAGNOSIS — G8929 Other chronic pain: Secondary | ICD-10-CM | POA: Diagnosis not present

## 2021-06-08 DIAGNOSIS — M5412 Radiculopathy, cervical region: Secondary | ICD-10-CM | POA: Diagnosis not present

## 2021-06-08 DIAGNOSIS — M542 Cervicalgia: Secondary | ICD-10-CM | POA: Diagnosis not present

## 2021-06-08 DIAGNOSIS — M5136 Other intervertebral disc degeneration, lumbar region: Secondary | ICD-10-CM | POA: Diagnosis not present

## 2021-06-08 DIAGNOSIS — M545 Low back pain, unspecified: Secondary | ICD-10-CM | POA: Diagnosis not present

## 2021-07-10 DIAGNOSIS — G8929 Other chronic pain: Secondary | ICD-10-CM | POA: Diagnosis not present

## 2021-07-10 DIAGNOSIS — M542 Cervicalgia: Secondary | ICD-10-CM | POA: Diagnosis not present

## 2021-07-10 DIAGNOSIS — M545 Low back pain, unspecified: Secondary | ICD-10-CM | POA: Diagnosis not present

## 2021-07-10 DIAGNOSIS — Z79891 Long term (current) use of opiate analgesic: Secondary | ICD-10-CM | POA: Diagnosis not present

## 2021-07-10 DIAGNOSIS — M5412 Radiculopathy, cervical region: Secondary | ICD-10-CM | POA: Diagnosis not present

## 2021-07-10 DIAGNOSIS — M5136 Other intervertebral disc degeneration, lumbar region: Secondary | ICD-10-CM | POA: Diagnosis not present

## 2021-07-10 DIAGNOSIS — G894 Chronic pain syndrome: Secondary | ICD-10-CM | POA: Diagnosis not present

## 2021-07-26 ENCOUNTER — Encounter: Payer: Self-pay | Admitting: Physician Assistant

## 2021-07-26 ENCOUNTER — Ambulatory Visit (INDEPENDENT_AMBULATORY_CARE_PROVIDER_SITE_OTHER): Payer: Medicare Other | Admitting: Physician Assistant

## 2021-07-26 ENCOUNTER — Telehealth: Payer: Self-pay | Admitting: *Deleted

## 2021-07-26 ENCOUNTER — Other Ambulatory Visit: Payer: Medicare Other

## 2021-07-26 VITALS — BP 126/88 | HR 59 | Ht 65.0 in | Wt 208.6 lb

## 2021-07-26 DIAGNOSIS — R195 Other fecal abnormalities: Secondary | ICD-10-CM | POA: Diagnosis not present

## 2021-07-26 DIAGNOSIS — R194 Change in bowel habit: Secondary | ICD-10-CM | POA: Diagnosis not present

## 2021-07-26 DIAGNOSIS — Z9884 Bariatric surgery status: Secondary | ICD-10-CM | POA: Diagnosis not present

## 2021-07-26 DIAGNOSIS — R140 Abdominal distension (gaseous): Secondary | ICD-10-CM | POA: Diagnosis not present

## 2021-07-26 NOTE — Patient Instructions (Addendum)
Your provider has requested that you go to the basement level for lab work before leaving today. Press "B" on the elevator. The lab is located at the first door on the left as you exit the elevator. ? ?Start Benefiber or Citrucel 2 teaspoons in 8 ounces of liquid daily.  ? ?Start a daily probiotic such as Electronics engineer for 2 months. ? ?Follow up in 2 month to discuss colon.  ? ?If you are age 63 or older, your body mass index should be between 23-30. Your Body mass index is 34.71 kg/m?Marland Kitchen If this is out of the aforementioned range listed, please consider follow up with your Primary Care Provider. ? ?If you are age 79 or younger, your body mass index should be between 19-25. Your Body mass index is 34.71 kg/m?Marland Kitchen If this is out of the aformentioned range listed, please consider follow up with your Primary Care Provider.  ? ?________________________________________________________ ? ?The [facility] providers would like to encourage you to use [facility] to communicate with providers for non-urgent requests or questions.  Due to long hold times on the telephone, sending your provider a message by [facility] may be a faster and more efficient way to get a response.  Please allow 48 business hours for a response.  Please remember that this is for non-urgent requests.  ?_______________________________________________________ ? ?

## 2021-07-26 NOTE — Telephone Encounter (Signed)
Pharmacy, please review request for holding of Eliquis. They are on Elqiuis for PE, though Edd Fabian refilled at last OV.  ?

## 2021-07-26 NOTE — Telephone Encounter (Signed)
Pt has appt 09/20/21 with Christen Bame, NP. Will add pre op clearance possibly to be addressed. See previous notes. PE 03/25/21. I will fax FYI to requesting office.  ?

## 2021-07-26 NOTE — Telephone Encounter (Signed)
[name] Pre-operative Risk Assessment  ?   ?Request for surgical clearance:     Endoscopy Procedure ? ?What type of surgery is being performed?     colonoscopy ? ?When is this surgery scheduled?     TBD ? ?What type of clearance is required ?   Pharmacy/Cardiac clearance ? ?Are there any medications that need to be held prior to surgery and how long? Eliquis 2 days ? ?Practice name and name of physician performing surgery?      [facility] ? ?What is your office phone and fax number?      Phone- [phone]  Fax- [phone] ? ?Anesthesia type (None, local, MAC, general) ?       [facility] ? ?

## 2021-07-26 NOTE — Telephone Encounter (Signed)
? ?  Name:  [name] [name]  ?DOB:  1961/03/13  ?MRN:  [id number]  ? ?Primary Cardiologist: Tobias Alexander, MD ? ?Chart reviewed as part of pre-operative protocol coverage.  Kaylor Maiers Lee-Gaines was last seen on 05/18/2021 by Edd Fabian, NP, and was doing well.  She was diagnosed with a PE on 03/25/2021 and has been on Eliquis.  Pharmacy recommends anticoagulation NOT be interrupted for 6 months with repeat risk stratification being performed at that time.  ? ?- Please contact requesting surgeon's office via preferred method (i.e, phone, fax) to inform them of need for appointment prior to surgery. ? ?Eula Listen, PA-C ?07/26/2021, 10:30 AM ? ? ? ?
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
## 2021-07-26 NOTE — Telephone Encounter (Signed)
Patient with diagnosis of PE on Eliquis for anticoagulation.   ? ?Procedure: colonoscopy ?Date of procedure: TBD ? ?Patient had PE on 03/28/21. Do not recommend holding anticoagulation until at least 6 months has passed.  Recommend postponing procedure until at least July 2023 and then attempting clearance again. ?

## 2021-07-30 NOTE — Telephone Encounter (Signed)
June schedule is now available. Lm on vm for patient to return call. ?

## 2021-07-31 ENCOUNTER — Telehealth: Payer: Self-pay | Admitting: Physician Assistant

## 2021-07-31 NOTE — Telephone Encounter (Signed)
Patient called back this morning and scheduled her colonoscopy with Dr. Silverio Decamp for June 16 at 2:30 p.m.  She will need instructions.  She also asked if you'd received clearance from her cardiologist.  Please call patient and advise.  Thank you. ?

## 2021-07-31 NOTE — Telephone Encounter (Signed)
Spoke with patient and scheduled appt with [name] [name] on 09/27/21 @ 9 am. ?

## 2021-07-31 NOTE — Telephone Encounter (Signed)
Spoke with patient and explained we needed to schedule a follow up with her after she see cardiologist in June. Canceled colonoscopy for 09/19/21. Scheduled appointment with Victorino Dike on 09/27/21 @ 9 am. ?

## 2021-08-01 ENCOUNTER — Other Ambulatory Visit (HOSPITAL_COMMUNITY)
Admission: RE | Admit: 2021-08-01 | Discharge: 2021-08-01 | Disposition: A | Discharge status: Home / Self Care | Payer: Medicare Other | Source: Ambulatory Visit | Attending: Nurse Practitioner | Admitting: Nurse Practitioner

## 2021-08-01 ENCOUNTER — Other Ambulatory Visit: Payer: Self-pay | Admitting: Nurse Practitioner

## 2021-08-01 DIAGNOSIS — Z01419 Encounter for gynecological examination (general) (routine) without abnormal findings: Secondary | ICD-10-CM | POA: Insufficient documentation

## 2021-08-01 DIAGNOSIS — Z1151 Encounter for screening for human papillomavirus (HPV): Secondary | ICD-10-CM | POA: Insufficient documentation

## 2021-08-03 LAB — CYTOLOGY - PAP
Comment: NEGATIVE
Diagnosis: NEGATIVE
High risk HPV: NEGATIVE

## 2021-08-07 DIAGNOSIS — M545 Low back pain, unspecified: Secondary | ICD-10-CM | POA: Diagnosis not present

## 2021-08-07 DIAGNOSIS — M542 Cervicalgia: Secondary | ICD-10-CM | POA: Diagnosis not present

## 2021-08-07 DIAGNOSIS — Z79891 Long term (current) use of opiate analgesic: Secondary | ICD-10-CM | POA: Diagnosis not present

## 2021-08-07 DIAGNOSIS — M5412 Radiculopathy, cervical region: Secondary | ICD-10-CM | POA: Diagnosis not present

## 2021-08-07 DIAGNOSIS — G8929 Other chronic pain: Secondary | ICD-10-CM | POA: Diagnosis not present

## 2021-08-07 DIAGNOSIS — G894 Chronic pain syndrome: Secondary | ICD-10-CM | POA: Diagnosis not present

## 2021-08-07 DIAGNOSIS — M5136 Other intervertebral disc degeneration, lumbar region: Secondary | ICD-10-CM | POA: Diagnosis not present

## 2021-08-23 NOTE — Progress Notes (Signed)
Reviewed and agree with documentation and assessment and plan. K. Veena Ronnica Dreese , MD   

## 2021-08-31 ENCOUNTER — Encounter: Payer: Self-pay | Admitting: Podiatry

## 2021-08-31 ENCOUNTER — Ambulatory Visit (INDEPENDENT_AMBULATORY_CARE_PROVIDER_SITE_OTHER): Payer: Medicare Other | Admitting: Podiatry

## 2021-08-31 DIAGNOSIS — B351 Tinea unguium: Secondary | ICD-10-CM

## 2021-08-31 DIAGNOSIS — D6859 Other primary thrombophilia: Secondary | ICD-10-CM

## 2021-08-31 DIAGNOSIS — M79676 Pain in unspecified toe(s): Secondary | ICD-10-CM | POA: Diagnosis not present

## 2021-08-31 DIAGNOSIS — M109 Gout, unspecified: Secondary | ICD-10-CM | POA: Insufficient documentation

## 2021-08-31 DIAGNOSIS — L603 Nail dystrophy: Secondary | ICD-10-CM | POA: Diagnosis not present

## 2021-08-31 NOTE — Addendum Note (Signed)
Addended by: Cranford Mon R on: 08/31/2021 11:39 AM   Modules accepted: Orders

## 2021-08-31 NOTE — Progress Notes (Signed)
This patient returns to my office for at risk foot care.  This patient requires this care by a professional since this patient will be at risk due to having thrombophlia.  This patient is unable to cut nails herself since the patient cannot reach her nails.These nails are painful walking and wearing shoes.  This patient presents for at risk foot care today.  General Appearance  Alert, conversant and in no acute stress.  Vascular  Dorsalis pedis and posterior tibial  pulses are palpable  bilaterally.  Capillary return is within normal limits  bilaterally. Temperature is within normal limits  bilaterally.  Neurologic  Senn-Weinstein monofilament wire test within normal limits  bilaterally. Muscle power within normal limits bilaterally.  Nails Thick disfigured discolored nails with subungual debris  1,2,3,5 left foot. No evidence of bacterial infection or drainage bilaterally.  Orthopedic  No limitations of motion  feet .  No crepitus or effusions noted.  No bony pathology or digital deformities noted.  Skin  normotropic skin with no porokeratosis noted bilaterally.  No signs of infections or ulcers noted.     Onychomycosis  Pain in right toes  Pain in left toes  Consent was obtained for treatment procedures.   Mechanical debridement of nails 1-5  bilaterally performed with a nail nipper.  Filed with dremel without incident. Discussed treatment for fungus with this patient.   Return office visit   prn                    Told patient to return for periodic foot care and evaluation due to potential at risk complications.   Helane Gunther DPM

## 2021-09-06 ENCOUNTER — Telehealth: Payer: Self-pay | Admitting: *Deleted

## 2021-09-06 NOTE — Telephone Encounter (Addendum)
[facility]) [phone] is calling because there was no specimen sent,will have to be recollected,was a small hole in bag, no nails sent. She is going to cancel this case.  Called and explained that per Dr Stacie Acres a new nail sample will have to be taken at  next appointment. She verbalized understanding.

## 2021-09-07 DIAGNOSIS — M5136 Other intervertebral disc degeneration, lumbar region: Secondary | ICD-10-CM | POA: Diagnosis not present

## 2021-09-07 DIAGNOSIS — M545 Low back pain, unspecified: Secondary | ICD-10-CM | POA: Diagnosis not present

## 2021-09-07 DIAGNOSIS — M5412 Radiculopathy, cervical region: Secondary | ICD-10-CM | POA: Diagnosis not present

## 2021-09-07 DIAGNOSIS — Z79891 Long term (current) use of opiate analgesic: Secondary | ICD-10-CM | POA: Diagnosis not present

## 2021-09-07 DIAGNOSIS — G8929 Other chronic pain: Secondary | ICD-10-CM | POA: Diagnosis not present

## 2021-09-07 DIAGNOSIS — G894 Chronic pain syndrome: Secondary | ICD-10-CM | POA: Diagnosis not present

## 2021-09-07 DIAGNOSIS — M542 Cervicalgia: Secondary | ICD-10-CM | POA: Diagnosis not present
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
## 2021-09-19 ENCOUNTER — Ambulatory Visit (HOSPITAL_BASED_OUTPATIENT_CLINIC_OR_DEPARTMENT_OTHER): Payer: Medicare Other | Admitting: General Practice

## 2021-09-19 ENCOUNTER — Encounter: Payer: Medicare Other | Admitting: Gastroenterology

## 2021-09-20 ENCOUNTER — Encounter: Payer: Self-pay | Admitting: Nurse Practitioner

## 2021-09-20 ENCOUNTER — Ambulatory Visit (INDEPENDENT_AMBULATORY_CARE_PROVIDER_SITE_OTHER): Payer: Medicare Other | Admitting: Nurse Practitioner

## 2021-09-20 VITALS — BP 134/85 | HR 68 | Ht 65.0 in | Wt 210.2 lb

## 2021-09-20 DIAGNOSIS — E785 Hyperlipidemia, unspecified: Secondary | ICD-10-CM

## 2021-09-20 DIAGNOSIS — I5032 Chronic diastolic (congestive) heart failure: Secondary | ICD-10-CM | POA: Diagnosis not present

## 2021-09-20 DIAGNOSIS — I471 Supraventricular tachycardia: Secondary | ICD-10-CM | POA: Diagnosis not present

## 2021-09-20 DIAGNOSIS — I10 Essential (primary) hypertension: Secondary | ICD-10-CM

## 2021-09-20 NOTE — Patient Instructions (Signed)
Medication Instructions:   Your physician recommends that you continue on your current medications as directed. Please refer to the Current Medication list given to you today.   *If you need a refill on your cardiac medications before your next appointment, please call your pharmacy*   Lab Work:  None ordered.  If you have labs (blood work) drawn today and your tests are completely normal, you will receive your results only by: MyChart Message (if you have MyChart) OR A paper copy in the mail If you have any lab test that is abnormal or we need to change your treatment, we will call you to review the results.   Testing/Procedures:  None ordered.   Follow-Up: At [facility], you and your health needs are our priority.  As part of our continuing mission to provide you with exceptional heart care, we have created designated Provider Care Teams.  These Care Teams include your primary Cardiologist (physician) and Advanced Practice Providers (APPs -  Physician Assistants and Nurse Practitioners) who all work together to provide you with the care you need, when you need it.  We recommend signing up for the patient portal called "MyChart".  Sign up information is provided on this After Visit Summary.  MyChart is used to connect with patients for Virtual Visits (Telemedicine).  Patients are able to view lab/test results, encounter notes, upcoming appointments, etc.  Non-urgent messages can be sent to your provider as well.   To learn more about what you can do with MyChart, go to [email or web address].    Your next appointment:   6 month(s)  The format for your next appointment:   In Person  Provider:   Heather E Pemberton, MD     Important Information About Sugar       

## 2021-09-21 ENCOUNTER — Encounter: Payer: Self-pay | Admitting: Nurse Practitioner

## 2021-09-27 ENCOUNTER — Ambulatory Visit: Payer: Medicare Other | Admitting: Physician Assistant

## 2021-10-12 DIAGNOSIS — M5136 Other intervertebral disc degeneration, lumbar region: Secondary | ICD-10-CM | POA: Diagnosis not present

## 2021-10-12 DIAGNOSIS — M5412 Radiculopathy, cervical region: Secondary | ICD-10-CM | POA: Diagnosis not present

## 2021-10-12 DIAGNOSIS — Z79891 Long term (current) use of opiate analgesic: Secondary | ICD-10-CM | POA: Diagnosis not present

## 2021-10-12 DIAGNOSIS — G8929 Other chronic pain: Secondary | ICD-10-CM | POA: Diagnosis not present

## 2021-10-12 DIAGNOSIS — G894 Chronic pain syndrome: Secondary | ICD-10-CM | POA: Diagnosis not present

## 2021-10-12 DIAGNOSIS — M542 Cervicalgia: Secondary | ICD-10-CM | POA: Diagnosis not present

## 2021-10-12 DIAGNOSIS — M545 Low back pain, unspecified: Secondary | ICD-10-CM | POA: Diagnosis not present

## 2021-10-15 ENCOUNTER — Encounter: Payer: Self-pay | Admitting: Pulmonary Disease

## 2021-10-15 ENCOUNTER — Ambulatory Visit (INDEPENDENT_AMBULATORY_CARE_PROVIDER_SITE_OTHER): Payer: Medicare Other | Admitting: Pulmonary Disease

## 2021-10-15 VITALS — BP 124/64 | HR 63 | Temp 98.2°F | Ht 65.0 in | Wt 206.4 lb

## 2021-10-15 DIAGNOSIS — Z8616 Personal history of COVID-19: Secondary | ICD-10-CM

## 2021-10-15 DIAGNOSIS — I2609 Other pulmonary embolism with acute cor pulmonale: Secondary | ICD-10-CM | POA: Diagnosis not present

## 2021-10-15 LAB — D-DIMER, QUANTITATIVE: D-Dimer, Quant: <0.19 mcg/mL FEU (ref <0.50)

## 2021-10-15 NOTE — Patient Instructions (Signed)
I am glad your breathing is better We will check a D-dimer today and if negative then can stop Eliquis.  We will call you with the results Follow-up as needed.

## 2021-10-15 NOTE — Progress Notes (Signed)
[name] [name]    [id number]    28-Nov-1960  Primary Care Physician:Varadarajan, Ronie Spies, MD  Referring Physician: Leeroy Cha, MD [address] [place],  [place] [address]  Chief complaint: Follow-up for PE  HPI: 61 year old with asthma, CHF, depression, diabetes Admitted on 12/25 but bilateral subsegmental PE in the setting of COVID-19 infection.  She was started on heparin and converted to Eliquis.  Treated with remdesivir for 3 days and discharged home  She previously had a COVID infection in October 2021 which did not require hospitalization Post discharge she complains of chronic dyspnea on exertion which even preceded the hospitalization.  No cough, leg swelling.  Pets: Dogs Occupation: Occupational psychologist, Barista.  Currently retired Exposures: No mold, hot tub, Jacuzzi.  No feather pillows or comforters Smoking history: Minimal smoking as a teenager.  Quit in 1979 Travel history: Previously lived in [place] in [place].  No significant recent travel Relevant family history: No family history of lung disease  Interim history: She is here for follow-up for pulmonary embolism in the setting of COVID-19 She has been on anticoagulation for over 6 months.  States that breathing is doing well.  No issues today except for sinus congestion and drainage   Outpatient Encounter Medications as of 10/15/2021  Medication Sig   allopurinol (ZYLOPRIM) 100 MG tablet Take 100 mg by mouth daily.   apixaban (ELIQUIS) 5 MG TABS tablet Take 1 tablet (5 mg total) by mouth 2 (two) times daily.   buPROPion (WELLBUTRIN XL) 300 MG 24 hr tablet Take 300 mg by mouth daily.   Calcium Citrate-Vitamin D 315-5 MG-MCG TABS 1 tablet   CARESTART COVID-19 HOME TEST KIT See admin instructions.   cariprazine (VRAYLAR) capsule Take 3 mg by mouth daily.    folic acid (FOLVITE) 177 MCG tablet Take 400 mcg by mouth daily. Taking 492m   furosemide (LASIX) 40 MG tablet  Take 1 tablet (40 mg total) by mouth daily.   gabapentin (NEURONTIN) 800 MG tablet Take 1 tablet by mouth every 12 (twelve) hours.   metoprolol succinate (TOPROL-XL) 50 MG 24 hr tablet Take 1 tablet (50 mg total) by mouth 2 (two) times daily. Take with or immediately following a meal.   nitroGLYCERIN (NITROSTAT) 0.4 MG SL tablet Place 1 tablet (0.4 mg total) under the tongue every 5 (five) minutes as needed for chest pain.   Oxycodone HCl 10 MG TABS Take 10 mg by mouth.   tiZANidine (ZANAFLEX) 4 MG tablet Take 4 mg by mouth every 8 (eight) hours as needed for muscle spasms.   VITAMIN E PO Take 1 capsule by mouth in the morning and at bedtime.   [DISCONTINUED] traZODone (DESYREL) 100 MG tablet Take 100 mg by mouth at bedtime. (Patient not taking: Reported on 09/20/2021)   No facility-administered encounter medications on file as of 10/15/2021.    Physical Exam: Blood pressure 124/64, pulse 63, temperature 98.2 F (36.8 C), temperature source Oral, height '5\' 5"'  (1.651 m), weight 206 lb 6.4 oz (93.6 kg), SpO2 92 %. Gen:      No acute distress HEENT:  EOMI, sclera anicteric Neck:     No masses; no thyromegaly Lungs:    Clear to auscultation bilaterally; normal respiratory effort CV:         Regular rate and rhythm; no murmurs Abd:      + bowel sounds; soft, non-tender; no palpable masses, no distension Ext:    No edema;  adequate peripheral perfusion Skin:      Warm and dry; no rash Neuro: alert and oriented x 3 Psych: normal mood and affect   Data Reviewed: Imaging: CT angiogram 03/26/2021-bilateral pulmonary embolism with RV strain, mildly distended pulmonary trunk, patchy opacities at the lung base, small left effusion.  I have reviewed the images personally.  Lower extremity duplex 03/26/2021-no evidence of DVT  PFTs:  Labs:  Echocardiogram: 03/26/2021-LVEF 60 to 96%, grade 1 diastolic dysfunction, normal RV size and function.  Mildly elevated PA systolic pressure.  Estimated RVSP  36.2  Assessment:  Bilateral pulmonary embolism in the setting of COVID-19 infection She is currently on Eliquis.  Has chronic dyspnea on exertion which preceded the COVID and PE Follows with cardiology for chronic heart failure   Plan/Recommendations: She has finished 6 months of therapy.  Check D-dimer and if negative stop anticoagulation   Marshell Garfinkel MD [facility] 10/15/2021, 9:12 AM  CC: Leeroy Cha,*

## 2021-10-15 NOTE — Addendum Note (Signed)
Addended by: Jacquiline Doe on: 10/15/2021 09:22 AM   Modules accepted: Orders

## 2021-10-19 DIAGNOSIS — I2699 Other pulmonary embolism without acute cor pulmonale: Secondary | ICD-10-CM | POA: Diagnosis not present

## 2021-10-19 DIAGNOSIS — J209 Acute bronchitis, unspecified: Secondary | ICD-10-CM | POA: Diagnosis not present

## 2021-10-23 ENCOUNTER — Encounter: Payer: Self-pay | Admitting: Physician Assistant

## 2021-10-23 ENCOUNTER — Ambulatory Visit (INDEPENDENT_AMBULATORY_CARE_PROVIDER_SITE_OTHER): Payer: Medicare Other | Admitting: Physician Assistant

## 2021-10-23 VITALS — BP 130/80 | HR 64 | Ht 64.0 in | Wt 209.5 lb

## 2021-10-23 DIAGNOSIS — R195 Other fecal abnormalities: Secondary | ICD-10-CM

## 2021-10-23 DIAGNOSIS — Z1211 Encounter for screening for malignant neoplasm of colon: Secondary | ICD-10-CM | POA: Diagnosis not present

## 2021-10-23 DIAGNOSIS — Z86711 Personal history of pulmonary embolism: Secondary | ICD-10-CM | POA: Diagnosis not present

## 2021-10-23 MED ORDER — NA SULFATE-K SULFATE-MG SULF 17.5-3.13-1.6 GM/177ML PO SOLN: 1.0000 | Freq: Once | ORAL | 0 refills | Status: AC

## 2021-10-23 NOTE — Patient Instructions (Addendum)
You have been scheduled for a colonoscopy. Please follow written instructions given to you at your visit today.  Please pick up your prep supplies at the pharmacy within the next 1-3 days. If you use inhalers (even only as needed), please bring them with you on the day of your procedure.    If you are age 63 or older, your body mass index should be between 23-30. Your Body mass index is 35.96 kg/m. If this is out of the aforementioned range listed, please consider follow up with your Primary Care Provider.  If you are age 61 or younger, your body mass index should be between 19-25. Your Body mass index is 35.96 kg/m. If this is out of the aformentioned range listed, please consider follow up with your Primary Care Provider.   ________________________________________________________  The  GI providers would like to encourage you to use [facility] to communicate with providers for non-urgent requests or questions.  Due to long hold times on the telephone, sending your provider a message by [facility] may be a faster and more efficient way to get a response.  Please allow 48 business hours for a response.  Please remember that this is for non-urgent requests.  _______________________________________________________
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
## 2021-11-09 DIAGNOSIS — M5136 Other intervertebral disc degeneration, lumbar region: Secondary | ICD-10-CM | POA: Diagnosis not present

## 2021-11-09 DIAGNOSIS — M542 Cervicalgia: Secondary | ICD-10-CM | POA: Diagnosis not present

## 2021-11-09 DIAGNOSIS — M5412 Radiculopathy, cervical region: Secondary | ICD-10-CM | POA: Diagnosis not present

## 2021-11-09 DIAGNOSIS — G8929 Other chronic pain: Secondary | ICD-10-CM | POA: Diagnosis not present

## 2021-11-09 DIAGNOSIS — G894 Chronic pain syndrome: Secondary | ICD-10-CM | POA: Diagnosis not present

## 2021-11-09 DIAGNOSIS — Z79891 Long term (current) use of opiate analgesic: Secondary | ICD-10-CM | POA: Diagnosis not present

## 2021-11-09 DIAGNOSIS — M545 Low back pain, unspecified: Secondary | ICD-10-CM | POA: Diagnosis not present

## 2021-11-13 ENCOUNTER — Other Ambulatory Visit: Payer: Medicare Other

## 2021-11-28 ENCOUNTER — Encounter: Payer: Medicare Other | Admitting: Gastroenterology

## 2021-12-07 ENCOUNTER — Ambulatory Visit: Payer: Medicare Other | Admitting: Podiatry

## 2021-12-11 DIAGNOSIS — M545 Low back pain, unspecified: Secondary | ICD-10-CM | POA: Diagnosis not present

## 2021-12-11 DIAGNOSIS — G894 Chronic pain syndrome: Secondary | ICD-10-CM | POA: Diagnosis not present

## 2021-12-11 DIAGNOSIS — M5136 Other intervertebral disc degeneration, lumbar region: Secondary | ICD-10-CM | POA: Diagnosis not present

## 2021-12-11 DIAGNOSIS — M542 Cervicalgia: Secondary | ICD-10-CM | POA: Diagnosis not present

## 2021-12-11 DIAGNOSIS — Z79891 Long term (current) use of opiate analgesic: Secondary | ICD-10-CM | POA: Diagnosis not present

## 2021-12-11 DIAGNOSIS — G8929 Other chronic pain: Secondary | ICD-10-CM | POA: Diagnosis not present

## 2021-12-11 DIAGNOSIS — M5412 Radiculopathy, cervical region: Secondary | ICD-10-CM | POA: Diagnosis not present

## 2021-12-25 ENCOUNTER — Telehealth: Payer: Self-pay | Admitting: Gastroenterology

## 2021-12-25 ENCOUNTER — Encounter: Payer: Medicare Other | Admitting: Gastroenterology

## 2021-12-25 ENCOUNTER — Other Ambulatory Visit: Payer: Self-pay

## 2021-12-25 NOTE — Telephone Encounter (Signed)
We will have to discharge patient from our practice for non compliance, has h/o multiple no show and cancellation. Thanks

## 2021-12-25 NOTE — Telephone Encounter (Signed)
Good Morning Dr. Silverio Decamp  I called this patient at 10:30 am to see if she was coming for her procedure.  I left a message for her to call us if she would be late or if she wanted to reschedule.  I will NO SHOW her.  [employer]

## 2021-12-31 ENCOUNTER — Telehealth: Payer: Self-pay | Admitting: Cardiology

## 2021-12-31 DIAGNOSIS — I2699 Other pulmonary embolism without acute cor pulmonale: Secondary | ICD-10-CM | POA: Diagnosis not present

## 2021-12-31 DIAGNOSIS — I5042 Chronic combined systolic (congestive) and diastolic (congestive) heart failure: Secondary | ICD-10-CM | POA: Diagnosis not present

## 2021-12-31 DIAGNOSIS — I10 Essential (primary) hypertension: Secondary | ICD-10-CM | POA: Diagnosis not present

## 2021-12-31 NOTE — Telephone Encounter (Addendum)
  Alexandrea, [place] - 12/31/2021 11:01 AM Freada Bergeron, MD  Sent: Mon December 31, 2021  3:13 PM  To: Nuala Alpha, LPN          Message  Perfect. Thank you!!

## 2021-12-31 NOTE — Telephone Encounter (Signed)
Pt c/o swelling: STAT is pt has developed SOB within 24 hours  How much weight have you gained and in what time span?  No weight gain  If swelling, where is the swelling located?  Ankles/feet   Are you currently taking a fluid pill?  Yes, patient is on Furosemide  Are you currently SOB?  No   Do you have a log of your daily weights (if so, list)?   Have you gained 3 pounds in a day or 5 pounds in a week?   Have you traveled recently?  No

## 2021-12-31 NOTE — Telephone Encounter (Signed)
I called the pt and she was at her PCP office being seen currently... I urged her to talk with her PCP about her concerns and she says she has been having lower extremity edema and I asked her to have them, look at them and possibly order pertinent labs on her while she is there. I will froward to Dr Jacolyn Reedy nurse as Juluis Rainier... the pt may call back after her appt.

## 2022-01-02 ENCOUNTER — Telehealth: Payer: Self-pay | Admitting: Cardiology

## 2022-01-02 NOTE — Telephone Encounter (Signed)
Pt states she got into the office to see her PCP yesterday for complaints of bilateral LEE (see telephone encounter from 10/2 about this).   PCP increased her lasix to 60 mg po daily for 3 days, then decrease back to 40 mg po daily thereafter, and follow-up with our office this week or next, for further management.   Pt states she took increased dose of lasix 60 mg today and has noticed improvement to her lower extremities.   Scheduled the pt to come into the office to see Dr. Johney Frame tomorrow 10/5 at 0800.  She is aware to arrive 15 mins prior to this appt.   Pt verbalized understanding and agrees with this plan.  Will send this message to Dr. Johney Frame to make her aware of this plan.
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
## 2022-01-02 NOTE — Telephone Encounter (Signed)
Patient is returning phone call.  °

## 2022-01-03 ENCOUNTER — Encounter: Payer: Self-pay | Admitting: Cardiology

## 2022-01-03 ENCOUNTER — Ambulatory Visit: Payer: Medicare Other | Attending: Cardiology | Admitting: Cardiology

## 2022-01-03 VITALS — BP 127/82 | HR 68 | Ht 64.0 in | Wt 203.4 lb

## 2022-01-03 DIAGNOSIS — R0609 Other forms of dyspnea: Secondary | ICD-10-CM | POA: Diagnosis not present

## 2022-01-03 DIAGNOSIS — I10 Essential (primary) hypertension: Secondary | ICD-10-CM

## 2022-01-03 DIAGNOSIS — I5033 Acute on chronic diastolic (congestive) heart failure: Secondary | ICD-10-CM | POA: Diagnosis not present

## 2022-01-03 DIAGNOSIS — I2609 Other pulmonary embolism with acute cor pulmonale: Secondary | ICD-10-CM

## 2022-01-03 DIAGNOSIS — Z79899 Other long term (current) drug therapy: Secondary | ICD-10-CM | POA: Diagnosis not present

## 2022-01-03 DIAGNOSIS — I471 Supraventricular tachycardia, unspecified: Secondary | ICD-10-CM | POA: Diagnosis not present

## 2022-01-03 DIAGNOSIS — G4733 Obstructive sleep apnea (adult) (pediatric): Secondary | ICD-10-CM

## 2022-01-03 DIAGNOSIS — R600 Localized edema: Secondary | ICD-10-CM | POA: Diagnosis not present

## 2022-01-03 DIAGNOSIS — E785 Hyperlipidemia, unspecified: Secondary | ICD-10-CM | POA: Diagnosis not present

## 2022-01-03 MED ORDER — SPIRONOLACTONE 25 MG PO TABS: 12.5000 mg | ORAL_TABLET | Freq: Every day | ORAL | 3 refills | Status: AC

## 2022-01-03 MED ORDER — POTASSIUM CHLORIDE CRYS ER 20 MEQ PO TBCR: EXTENDED_RELEASE_TABLET | ORAL | 0 refills | Status: DC

## 2022-01-03 NOTE — Patient Instructions (Signed)
Medication Instructions:   TAKE LASIX 60 MG BY MOUTH DAILY TODAY AND TOMORROW, THEN DECREASE BACK DOWN TO TAKING 40 MG BY MOUTH DAILY THEREAFTER  START TAKING POTASSIUM CHLORIDE 40 mEq BY MOUTH TODAY AND TOMORROW, THEN DECREASE TO TAKING 20 mEq BY MOUTH DAILY THEREAFTER   START TAKING SPIRONOLACTONE 12.5 MG BY MOUTH DAILY  *If you need a refill on your cardiac medications before your next appointment, please call your pharmacy*   Lab Work:  NEXT MONDAY 01/07/22 HERE IN THE OFFICE--BMET AND PRO-BNP  If you have labs (blood work) drawn today and your tests are completely normal, you will receive your results only by: [place] (if you have MyChart) OR A paper copy in the mail If you have any lab test that is abnormal or we need to change your treatment, we will call you to review the results.   Testing/Procedures:  Your physician has recommended that you have a sleep study. This test records several body functions during sleep, including: brain activity, eye movement, oxygen and carbon dioxide blood levels, heart rate and rhythm, breathing rate and rhythm, the flow of air through your mouth and nose, snoring, body muscle movements, and chest and belly movement.  OUR SLEEP COORDINATOR NINA JONES WILL BE IN CONTACT WITH YOU AGAIN TO GET THIS TEST RESCHEDULED.    Follow-Up:  2 WEEKS WITH AN EXTENDER IN THE OFFICE   Important Information About Sugar

## 2022-01-04 DIAGNOSIS — Z79891 Long term (current) use of opiate analgesic: Secondary | ICD-10-CM | POA: Diagnosis not present

## 2022-01-04 DIAGNOSIS — G8929 Other chronic pain: Secondary | ICD-10-CM | POA: Diagnosis not present

## 2022-01-04 DIAGNOSIS — M5136 Other intervertebral disc degeneration, lumbar region: Secondary | ICD-10-CM | POA: Diagnosis not present

## 2022-01-04 DIAGNOSIS — G894 Chronic pain syndrome: Secondary | ICD-10-CM | POA: Diagnosis not present

## 2022-01-04 DIAGNOSIS — M5412 Radiculopathy, cervical region: Secondary | ICD-10-CM | POA: Diagnosis not present

## 2022-01-04 DIAGNOSIS — M545 Low back pain, unspecified: Secondary | ICD-10-CM | POA: Diagnosis not present

## 2022-01-04 DIAGNOSIS — M542 Cervicalgia: Secondary | ICD-10-CM | POA: Diagnosis not present

## 2022-01-07 ENCOUNTER — Telehealth: Payer: Self-pay

## 2022-01-07 ENCOUNTER — Ambulatory Visit: Payer: Medicare Other | Attending: Cardiology

## 2022-01-07 DIAGNOSIS — R0609 Other forms of dyspnea: Secondary | ICD-10-CM | POA: Diagnosis not present

## 2022-01-07 DIAGNOSIS — I5033 Acute on chronic diastolic (congestive) heart failure: Secondary | ICD-10-CM | POA: Diagnosis not present

## 2022-01-07 DIAGNOSIS — R600 Localized edema: Secondary | ICD-10-CM

## 2022-01-07 DIAGNOSIS — G4733 Obstructive sleep apnea (adult) (pediatric): Secondary | ICD-10-CM

## 2022-01-07 DIAGNOSIS — I10 Essential (primary) hypertension: Secondary | ICD-10-CM

## 2022-01-07 DIAGNOSIS — Z79899 Other long term (current) drug therapy: Secondary | ICD-10-CM

## 2022-01-07 NOTE — Telephone Encounter (Signed)
01/07/2022 Pt came in at 830 am for lab draw, BMET and Pro-BNP, ordered by Dr. Johney Frame.   Pt spoke to lab and c/o mild bilateral foot edema.  Lab called [facility], stating Pt was requesting to be seen today.  I printed Dr. Jacolyn Reedy visit notes from 01/03/2022, and asked Pt if she followed doctors POC?  Pt stated she did, but has had feet swelling the past two days.   Pt scheduled to see Ambrose Pancoast, NP on 01/17/2022 at [facility].  Pt taken to scheduling and got appointment bumped up to 01/09/22 at 835 am with Elvin So NP.    Pt told to continue to follow Dr. Jacolyn Reedy POC, and that if edema of the feet worsen, or she begins to experience shortness of breath before her appointment on Wednesday, to go to nearest ER or Urgent care depending on severity of symptoms.    Pt understood directions, and continue POC.
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
## 2022-01-08 LAB — BASIC METABOLIC PANEL
BUN/Creatinine Ratio: 10 — ABNORMAL LOW (ref 12–28)
BUN: 11 mg/dL (ref 8–27)
CO2: 24 mmol/L (ref 20–29)
Calcium: 9.7 mg/dL (ref 8.7–10.3)
Chloride: 101 mmol/L (ref 96–106)
Creatinine, Ser: 1.06 mg/dL — ABNORMAL HIGH (ref 0.57–1.00)
Glucose: 86 mg/dL (ref 70–99)
Potassium: 3.9 mmol/L (ref 3.5–5.2)
Sodium: 139 mmol/L (ref 134–144)
eGFR: 59 mL/min/{1.73_m2} — ABNORMAL LOW (ref >59)

## 2022-01-08 LAB — PRO B NATRIURETIC PEPTIDE: NT-Pro BNP: 128 pg/mL (ref 0–287)

## 2022-01-09 ENCOUNTER — Ambulatory Visit: Payer: Medicare Other | Attending: Nurse Practitioner | Admitting: Nurse Practitioner

## 2022-01-09 ENCOUNTER — Encounter: Payer: Self-pay | Admitting: Nurse Practitioner

## 2022-01-09 VITALS — BP 126/92 | HR 66 | Ht 65.0 in | Wt 201.0 lb

## 2022-01-09 DIAGNOSIS — Z79899 Other long term (current) drug therapy: Secondary | ICD-10-CM

## 2022-01-09 DIAGNOSIS — R0609 Other forms of dyspnea: Secondary | ICD-10-CM | POA: Diagnosis not present

## 2022-01-09 DIAGNOSIS — I5032 Chronic diastolic (congestive) heart failure: Secondary | ICD-10-CM | POA: Diagnosis not present

## 2022-01-09 DIAGNOSIS — G4733 Obstructive sleep apnea (adult) (pediatric): Secondary | ICD-10-CM | POA: Diagnosis not present

## 2022-01-09 DIAGNOSIS — I10 Essential (primary) hypertension: Secondary | ICD-10-CM | POA: Diagnosis not present

## 2022-01-09 DIAGNOSIS — I5033 Acute on chronic diastolic (congestive) heart failure: Secondary | ICD-10-CM | POA: Diagnosis not present

## 2022-01-09 DIAGNOSIS — R600 Localized edema: Secondary | ICD-10-CM

## 2022-01-09 MED ORDER — POTASSIUM CHLORIDE CRYS ER 10 MEQ PO TBCR: 10.0000 meq | EXTENDED_RELEASE_TABLET | Freq: Two times a day (BID) | ORAL | 3 refills | Status: AC

## 2022-01-09 NOTE — Patient Instructions (Signed)
Medication Instructions:   CHANGE K-dur one ( 1) tablet by mouth ( 10 mEq) twice daily.   Please get Magnesium or Multiple Vitamin to take daily.   *If you need a refill on your cardiac medications before your next appointment, please call your pharmacy*   Lab Work:  Your physician recommends that you return for lab work on Wednesday, October 25. You can come in on the day of your appointment anytime between 7:30-4:30.  If you have labs (blood work) drawn today and your tests are completely normal, you will receive your results only by: [place] (if you have MyChart) OR A paper copy in the mail If you have any lab test that is abnormal or we need to change your treatment, we will call you to review the results.   Testing/Procedures:  None ordered.   Follow-Up: At [facility], you and your health needs are our priority.  As part of our continuing mission to provide you with exceptional heart care, we have created designated Provider Care Teams.  These Care Teams include your primary Cardiologist (physician) and Advanced Practice Providers (APPs -  Physician Assistants and Nurse Practitioners) who all work together to provide you with the care you need, when you need it.  We recommend signing up for the patient portal called "MyChart".  Sign up information is provided on this After Visit Summary.  MyChart is used to connect with patients for Virtual Visits (Telemedicine).  Patients are able to view lab/test results, encounter notes, upcoming appointments, etc.  Non-urgent messages can be sent to your provider as well.   To learn more about what you can do with MyChart, go to [email or web address].    Your next appointment:   1 month(s)  The format for your next appointment:   In Person  Provider:   Ambrose Pancoast, NP         Other Instructions  Low-Sodium Eating Plan Sodium, which is an element that makes up salt, helps you maintain a healthy balance of fluids  in your body. Too much sodium can increase your blood pressure and cause fluid and waste to be held in your body. Your health care provider or dietitian may recommend following this plan if you have high blood pressure (hypertension), kidney disease, liver disease, or heart failure. Eating less sodium can help lower your blood pressure, reduce swelling, and protect your heart, liver, and kidneys. What are tips for following this plan? Reading food labels The Nutrition Facts label lists the amount of sodium in one serving of the food. If you eat more than one serving, you must multiply the listed amount of sodium by the number of servings. Choose foods with less than 140 mg of sodium per serving. Avoid foods with 300 mg of sodium or more per serving. Shopping  Look for lower-sodium products, often labeled as "low-sodium" or "no salt added." Always check the sodium content, even if foods are labeled as "unsalted" or "no salt added." Buy fresh foods. Avoid canned foods and pre-made or frozen meals. Avoid canned, cured, or processed meats. Buy breads that have less than 80 mg of sodium per slice. Cooking  Eat more home-cooked food and less restaurant, buffet, and fast food. Avoid adding salt when cooking. Use salt-free seasonings or herbs instead of table salt or sea salt. Check with your health care provider or pharmacist before using salt substitutes. Cook with plant-based oils, such as canola, sunflower, or olive oil. Meal planning When eating at a restaurant,  ask that your food be prepared with less salt or no salt, if possible. Avoid dishes labeled as brined, pickled, cured, smoked, or made with soy sauce, miso, or teriyaki sauce. Avoid foods that contain MSG (monosodium glutamate). MSG is sometimes added to [place] food, bouillon, and some canned foods. Make meals that can be grilled, baked, poached, roasted, or steamed. These are generally made with less sodium. General information Most  people on this plan should limit their sodium intake to 1,500-2,000 mg (milligrams) of sodium each day. What foods should I eat? Fruits Fresh, frozen, or canned fruit. Fruit juice. Vegetables Fresh or frozen vegetables. "No salt added" canned vegetables. "No salt added" tomato sauce and paste. Low-sodium or reduced-sodium tomato and vegetable juice. Grains Low-sodium cereals, including oats, puffed wheat and rice, and shredded wheat. Low-sodium crackers. Unsalted rice. Unsalted pasta. Low-sodium bread. Whole-grain breads and whole-grain pasta. Meats and other proteins Fresh or frozen (no salt added) meat, poultry, seafood, and fish. Low-sodium canned tuna and salmon. Unsalted nuts. Dried peas, beans, and lentils without added salt. Unsalted canned beans. Eggs. Unsalted nut butters. Dairy Milk. Soy milk. Cheese that is naturally low in sodium, such as ricotta cheese, fresh mozzarella, or Swiss cheese. Low-sodium or reduced-sodium cheese. Cream cheese. Yogurt. Seasonings and condiments Fresh and dried herbs and spices. Salt-free seasonings. Low-sodium mustard and ketchup. Sodium-free salad dressing. Sodium-free light mayonnaise. Fresh or refrigerated horseradish. Lemon juice. Vinegar. Other foods Homemade, reduced-sodium, or low-sodium soups. Unsalted popcorn and pretzels. Low-salt or salt-free chips. The items listed above may not be a complete list of foods and beverages you can eat. Contact a dietitian for more information. What foods should I avoid? Vegetables Sauerkraut, pickled vegetables, and relishes. Olives. [place] fries. Onion rings. Regular canned vegetables (not low-sodium or reduced-sodium). Regular canned tomato sauce and paste (not low-sodium or reduced-sodium). Regular tomato and vegetable juice (not low-sodium or reduced-sodium). Frozen vegetables in sauces. Grains Instant hot cereals. Bread stuffing, pancake, and biscuit mixes. Croutons. Seasoned rice or pasta mixes. Noodle soup  cups. Boxed or frozen macaroni and cheese. Regular salted crackers. Self-rising flour. Meats and other proteins Meat or fish that is salted, canned, smoked, spiced, or pickled. Precooked or cured meat, such as sausages or meat loaves. Berniece Salines. Ham. Pepperoni. Hot dogs. Corned beef. Chipped beef. Salt pork. Jerky. Pickled herring. Anchovies and sardines. Regular canned tuna. Salted nuts. Dairy Processed cheese and cheese spreads. Hard cheeses. Cheese curds. Blue cheese. Feta cheese. String cheese. Regular cottage cheese. Buttermilk. Canned milk. Fats and oils Salted butter. Regular margarine. Ghee. Bacon fat. Seasonings and condiments Onion salt, garlic salt, seasoned salt, table salt, and sea salt. Canned and packaged gravies. Worcestershire sauce. Tartar sauce. Barbecue sauce. Teriyaki sauce. Soy sauce, including reduced-sodium. Steak sauce. Fish sauce. Oyster sauce. Cocktail sauce. Horseradish that you find on the shelf. Regular ketchup and mustard. Meat flavorings and tenderizers. Bouillon cubes. Hot sauce. Pre-made or packaged marinades. Pre-made or packaged taco seasonings. Relishes. Regular salad dressings. Salsa. Other foods Salted popcorn and pretzels. Corn chips and puffs. Potato and tortilla chips. Canned or dried soups. Pizza. Frozen entrees and pot pies. The items listed above may not be a complete list of foods and beverages you should avoid. Contact a dietitian for more information. Summary Eating less sodium can help lower your blood pressure, reduce swelling, and protect your heart, liver, and kidneys. Most people on this plan should limit their sodium intake to 1,500-2,000 mg (milligrams) of sodium each day. Canned, boxed, and frozen foods are high in  sodium. Restaurant foods, fast foods, and pizza are also very high in sodium. You also get sodium by adding salt to food. Try to cook at home, eat more fresh fruits and vegetables, and eat less fast food and canned, processed, or prepared  foods. This information is not intended to replace advice given to you by your health care provider. Make sure you discuss any questions you have with your health care provider. Document Revised: 04/23/2019 Document Reviewed: 02/17/2019 Elsevier Patient Education  [place] Eating Plan DASH stands for Dietary Approaches to Stop Hypertension. The DASH eating plan is a healthy eating plan that has been shown to: Reduce high blood pressure (hypertension). Reduce your risk for type 2 diabetes, heart disease, and stroke. Help with weight loss. What are tips for following this plan? Reading food labels Check food labels for the amount of salt (sodium) per serving. Choose foods with less than 5 percent of the Daily Value of sodium. Generally, foods with less than 300 milligrams (mg) of sodium per serving fit into this eating plan. To find whole grains, look for the word "whole" as the first word in the ingredient list. Shopping Buy products labeled as "low-sodium" or "no salt added." Buy fresh foods. Avoid canned foods and pre-made or frozen meals. Cooking Avoid adding salt when cooking. Use salt-free seasonings or herbs instead of table salt or sea salt. Check with your health care provider or pharmacist before using salt substitutes. Do not fry foods. Cook foods using healthy methods such as baking, boiling, grilling, roasting, and broiling instead. Cook with heart-healthy oils, such as olive, canola, avocado, soybean, or sunflower oil. Meal planning  Eat a balanced diet that includes: 4 or more servings of fruits and 4 or more servings of vegetables each day. Try to fill one-half of your plate with fruits and vegetables. 6-8 servings of whole grains each day. Less than 6 oz (170 g) of lean meat, poultry, or fish each day. A 3-oz (85-g) serving of meat is about the same size as a deck of cards. One egg equals 1 oz (28 g). 2-3 servings of low-fat dairy each day. One serving is 1  cup (237 mL). 1 serving of nuts, seeds, or beans 5 times each week. 2-3 servings of heart-healthy fats. Healthy fats called omega-3 fatty acids are found in foods such as walnuts, flaxseeds, fortified milks, and eggs. These fats are also found in cold-water fish, such as sardines, salmon, and mackerel. Limit how much you eat of: Canned or prepackaged foods. Food that is high in trans fat, such as some fried foods. Food that is high in saturated fat, such as fatty meat. Desserts and other sweets, sugary drinks, and other foods with added sugar. Full-fat dairy products. Do not salt foods before eating. Do not eat more than 4 egg yolks a week. Try to eat at least 2 vegetarian meals a week. Eat more home-cooked food and less restaurant, buffet, and fast food. Lifestyle When eating at a restaurant, ask that your food be prepared with less salt or no salt, if possible. If you drink alcohol: Limit how much you use to: 0-1 drink a day for women who are not pregnant. 0-2 drinks a day for men. Be aware of how much alcohol is in your drink. In the [place] one drink equals one 12 oz bottle of beer (355 mL), one 5 oz glass of wine (148 mL), or one 1 oz glass of hard liquor (44 mL). General information Avoid  eating more than 2,300 mg of salt a day. If you have hypertension, you may need to reduce your sodium intake to 1,500 mg a day. Work with your health care provider to maintain a healthy body weight or to lose weight. Ask what an ideal weight is for you. Get at least 30 minutes of exercise that causes your heart to beat faster (aerobic exercise) most days of the week. Activities may include walking, swimming, or biking. Work with your health care provider or dietitian to adjust your eating plan to your individual calorie needs. What foods should I eat? Fruits All fresh, dried, or frozen fruit. Canned fruit in natural juice (without added sugar). Vegetables Fresh or frozen vegetables (raw, steamed,  roasted, or grilled). Low-sodium or reduced-sodium tomato and vegetable juice. Low-sodium or reduced-sodium tomato sauce and tomato paste. Low-sodium or reduced-sodium canned vegetables. Grains Whole-grain or whole-wheat bread. Whole-grain or whole-wheat pasta. Brown rice. [place]. Bulgur. Whole-grain and low-sodium cereals. Pita bread. Low-fat, low-sodium crackers. Whole-wheat flour tortillas. Meats and other proteins Skinless chicken or [place]. Ground chicken or [place]. Pork with fat trimmed off. Fish and seafood. Egg whites. Dried beans, peas, or lentils. Unsalted nuts, nut butters, and seeds. Unsalted canned beans. Lean cuts of beef with fat trimmed off. Low-sodium, lean precooked or cured meat, such as sausages or meat loaves. Dairy Low-fat (1%) or fat-free (skim) milk. Reduced-fat, low-fat, or fat-free cheeses. Nonfat, low-sodium ricotta or cottage cheese. Low-fat or nonfat yogurt. Low-fat, low-sodium cheese. Fats and oils Soft margarine without trans fats. Vegetable oil. Reduced-fat, low-fat, or light mayonnaise and salad dressings (reduced-sodium). Canola, safflower, olive, avocado, soybean, and sunflower oils. Avocado. Seasonings and condiments Herbs. Spices. Seasoning mixes without salt. Other foods Unsalted popcorn and pretzels. Fat-free sweets. The items listed above may not be a complete list of foods and beverages you can eat. Contact a dietitian for more information. What foods should I avoid? Fruits Canned fruit in a light or heavy syrup. Fried fruit. Fruit in cream or butter sauce. Vegetables Creamed or fried vegetables. Vegetables in a cheese sauce. Regular canned vegetables (not low-sodium or reduced-sodium). Regular canned tomato sauce and paste (not low-sodium or reduced-sodium). Regular tomato and vegetable juice (not low-sodium or reduced-sodium). Angie Fava. Olives. Grains Baked goods made with fat, such as croissants, muffins, or some breads. Dry pasta or rice meal  packs. Meats and other proteins Fatty cuts of meat. Ribs. Fried meat. Berniece Salines. Bologna, salami, and other precooked or cured meats, such as sausages or meat loaves. Fat from the back of a pig (fatback). Bratwurst. Salted nuts and seeds. Canned beans with added salt. Canned or smoked fish. Whole eggs or egg yolks. Chicken or [place] with skin. Dairy Whole or 2% milk, cream, and half-and-half. Whole or full-fat cream cheese. Whole-fat or sweetened yogurt. Full-fat cheese. Nondairy creamers. Whipped toppings. Processed cheese and cheese spreads. Fats and oils Butter. Stick margarine. Lard. Shortening. Ghee. Bacon fat. Tropical oils, such as coconut, palm kernel, or palm oil. Seasonings and condiments Onion salt, garlic salt, seasoned salt, table salt, and sea salt. Worcestershire sauce. Tartar sauce. Barbecue sauce. Teriyaki sauce. Soy sauce, including reduced-sodium. Steak sauce. Canned and packaged gravies. Fish sauce. Oyster sauce. Cocktail sauce. Store-bought horseradish. Ketchup. Mustard. Meat flavorings and tenderizers. Bouillon cubes. Hot sauces. Pre-made or packaged marinades. Pre-made or packaged taco seasonings. Relishes. Regular salad dressings. Other foods Salted popcorn and pretzels. The items listed above may not be a complete list of foods and beverages you should avoid. Contact a dietitian for more  information. Where to find more information National Heart, Lung, and Blood Institute: https://wilson-eaton.com/ American Heart Association: www.heart.org Academy of Nutrition and Dietetics: www.eatright.[place]: www.kidney.org Summary The DASH eating plan is a healthy eating plan that has been shown to reduce high blood pressure (hypertension). It may also reduce your risk for type 2 diabetes, heart disease, and stroke. When on the DASH eating plan, aim to eat more fresh fruits and vegetables, whole grains, lean proteins, low-fat dairy, and heart-healthy fats. With the DASH  eating plan, you should limit salt (sodium) intake to 2,300 mg a day. If you have hypertension, you may need to reduce your sodium intake to 1,500 mg a day. Work with your health care provider or dietitian to adjust your eating plan to your individual calorie needs. This information is not intended to replace advice given to you by your health care provider. Make sure you discuss any questions you have with your health care provider. Document Revised: 02/19/2019 Document Reviewed: 02/19/2019 Elsevier Patient Education  [place] TO TAKE YOUR BLOOD PRESSURE: Rest 5 minutes before taking your blood pressure.  Don't smoke or drink caffeinated beverages for at least 30 minutes before. Take your blood pressure before (not after) you eat. Sit comfortably with your back supported and both feet on the floor (don't cross your legs). Elevate your arm to heart level on a table or a desk. Use the proper sized cuff. It should fit smoothly and snugly around your bare upper arm. There should be enough room to slip a fingertip under the cuff. The bottom edge of the cuff should be 1 inch above the crease of the elbow. Please monitor your blood pressure  call our office at [phone] or send a MyChart message in two weeks.  Important Information About Sugar

## 2022-01-17 ENCOUNTER — Ambulatory Visit: Payer: Medicare Other | Admitting: Nurse Practitioner

## 2022-01-23 ENCOUNTER — Other Ambulatory Visit: Payer: Medicare Other

## 2022-01-24 ENCOUNTER — Telehealth: Payer: Self-pay | Admitting: *Deleted

## 2022-01-24 NOTE — Telephone Encounter (Signed)
-----   Message from Freada Bergeron, [place] sent at 01/23/2022  6:36 PM EDT ----- Regarding: RE: NEEDS TO GET SLEEP STUDY RESCHEDULED PER DR. Johney Frame Precert is done sent to lab to schedule. ----- Message ----- From: Nuala Alpha, LPN Sent: 51/09/15   8:43 AM EDT To: Freada Bergeron, CMA; [facility] Sleep Studies Subject: NEEDS TO GET SLEEP STUDY RESCHEDULED PER DR.#  Dr. Johney Frame saw this pt in clinic.  The pt was suppose to get a sleep study done a year ago and everything was approved and scheduled, but it looks like she never went.   She needs to get this rescheduled and agrees to be compliant with this.  New split night sleep study order placed.   Can you please get this scheduled and let Dr. Johney Frame and myself know when it's scheduled?  She has known [facility].   Thanks [employer]

## 2022-02-14 ENCOUNTER — Ambulatory Visit: Payer: Medicare Other | Admitting: Nurse Practitioner

## 2022-02-14 DIAGNOSIS — Z79891 Long term (current) use of opiate analgesic: Secondary | ICD-10-CM | POA: Diagnosis not present

## 2022-02-14 DIAGNOSIS — G8929 Other chronic pain: Secondary | ICD-10-CM | POA: Diagnosis not present

## 2022-02-14 DIAGNOSIS — M5136 Other intervertebral disc degeneration, lumbar region: Secondary | ICD-10-CM | POA: Diagnosis not present

## 2022-02-14 DIAGNOSIS — M5412 Radiculopathy, cervical region: Secondary | ICD-10-CM | POA: Diagnosis not present

## 2022-02-14 DIAGNOSIS — G894 Chronic pain syndrome: Secondary | ICD-10-CM | POA: Diagnosis not present

## 2022-02-14 DIAGNOSIS — M545 Low back pain, unspecified: Secondary | ICD-10-CM | POA: Diagnosis not present

## 2022-02-14 DIAGNOSIS — M542 Cervicalgia: Secondary | ICD-10-CM | POA: Diagnosis not present

## 2022-02-28 ENCOUNTER — Other Ambulatory Visit (HOSPITAL_BASED_OUTPATIENT_CLINIC_OR_DEPARTMENT_OTHER): Payer: Self-pay | Admitting: General Practice

## 2022-02-28 DIAGNOSIS — I471 Supraventricular tachycardia, unspecified: Secondary | ICD-10-CM

## 2022-02-28 DIAGNOSIS — R0609 Other forms of dyspnea: Secondary | ICD-10-CM

## 2022-02-28 DIAGNOSIS — R42 Dizziness and giddiness: Secondary | ICD-10-CM

## 2022-02-28 DIAGNOSIS — I5032 Chronic diastolic (congestive) heart failure: Secondary | ICD-10-CM

## 2022-02-28 DIAGNOSIS — I10 Essential (primary) hypertension: Secondary | ICD-10-CM

## 2022-02-28 DIAGNOSIS — E785 Hyperlipidemia, unspecified: Secondary | ICD-10-CM
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
## 2022-03-04 ENCOUNTER — Ambulatory Visit: Payer: Medicare Other | Admitting: Nurse Practitioner

## 2022-03-04 ENCOUNTER — Ambulatory Visit: Payer: Medicare Other | Admitting: Cardiology

## 2022-03-04 DIAGNOSIS — R059 Cough, unspecified: Secondary | ICD-10-CM | POA: Diagnosis not present

## 2022-03-04 DIAGNOSIS — I5032 Chronic diastolic (congestive) heart failure: Secondary | ICD-10-CM

## 2022-03-04 DIAGNOSIS — J069 Acute upper respiratory infection, unspecified: Secondary | ICD-10-CM | POA: Diagnosis not present

## 2022-03-04 DIAGNOSIS — J019 Acute sinusitis, unspecified: Secondary | ICD-10-CM | POA: Diagnosis not present

## 2022-03-04 DIAGNOSIS — Z8673 Personal history of transient ischemic attack (TIA), and cerebral infarction without residual deficits: Secondary | ICD-10-CM | POA: Diagnosis not present

## 2022-03-04 DIAGNOSIS — Z03818 Encounter for observation for suspected exposure to other biological agents ruled out: Secondary | ICD-10-CM | POA: Diagnosis not present

## 2022-03-10 ENCOUNTER — Emergency Department (HOSPITAL_COMMUNITY)
Admission: EM | Admit: 2022-03-10 | Discharge: 2022-04-01 | Disposition: E | Payer: Medicare Other | Attending: Emergency Medicine | Admitting: Emergency Medicine

## 2022-03-10 DIAGNOSIS — Z8616 Personal history of COVID-19: Secondary | ICD-10-CM | POA: Insufficient documentation

## 2022-03-10 DIAGNOSIS — R55 Syncope and collapse: Secondary | ICD-10-CM | POA: Diagnosis not present

## 2022-03-10 DIAGNOSIS — E1165 Type 2 diabetes mellitus with hyperglycemia: Secondary | ICD-10-CM | POA: Diagnosis not present

## 2022-03-10 DIAGNOSIS — I469 Cardiac arrest, cause unspecified: Secondary | ICD-10-CM | POA: Diagnosis not present

## 2022-03-10 DIAGNOSIS — I499 Cardiac arrhythmia, unspecified: Secondary | ICD-10-CM | POA: Diagnosis not present

## 2022-03-10 DIAGNOSIS — I509 Heart failure, unspecified: Secondary | ICD-10-CM | POA: Diagnosis not present

## 2022-03-10 DIAGNOSIS — R0681 Apnea, not elsewhere classified: Secondary | ICD-10-CM | POA: Diagnosis not present

## 2022-03-10 DIAGNOSIS — R404 Transient alteration of awareness: Secondary | ICD-10-CM | POA: Diagnosis not present

## 2022-03-10 LAB — CBG MONITORING, ED: Glucose-Capillary: 141 mg/dL — ABNORMAL HIGH (ref 70–99)

## 2022-03-10 MED ORDER — EPINEPHRINE 1 MG/10ML IJ SOSY
PREFILLED_SYRINGE | INTRAMUSCULAR | Status: AC | PRN
Start: 2022-03-10 — End: 2022-03-10
  Administered 2022-03-10 (×3): 1 mg via INTRAVENOUS

## 2022-03-10 MED ORDER — EPINEPHRINE 1 MG/10ML IJ SOSY
PREFILLED_SYRINGE | INTRAMUSCULAR | Status: AC | PRN
Start: 2022-03-10 — End: 2022-03-10
  Administered 2022-03-10: 1 mg via INTRAVENOUS

## 2022-03-10 MED ORDER — SODIUM BICARBONATE 8.4 % IV SOLN
INTRAVENOUS | Status: AC | PRN
Start: 2022-03-10 — End: 2022-03-10
  Administered 2022-03-10: 50 meq via INTRAVENOUS

## 2022-03-19 ENCOUNTER — Encounter (HOSPITAL_BASED_OUTPATIENT_CLINIC_OR_DEPARTMENT_OTHER): Payer: Medicare Other | Admitting: Cardiovascular Disease

## 2022-04-01 NOTE — ED Provider Notes (Addendum)
[facility] EMERGENCY DEPARTMENT Provider Note   CSN: [id number] Arrival date & time: 03-29-2022  1047     History  No chief complaint on file.   [name] [name] is a 62 y.o. female.  62 year old female medical history detailed below presents for evaluation.  Patient arrives from home with EMS.  Per EMS, patient was in her normal state of health until early this morning when she took a shower.  Shortly after the shower she syncopized and then quickly became unresponsive.  EMS was called.  EMS reports that patient was pulseless and unresponsive on their initial evaluation.  King tube placed in the airway, IO placed the patient's right lower extremity, and CPR initiated.  Patient has received approximately 25 minutes of CPR during EMS transport.  Patient is also received 5 doses of epinephrine.  Patient remains in PEA.      The history is provided by the EMS personnel and medical records.       Home Medications Prior to Admission medications   Medication Sig Start Date End Date Taking? Authorizing Provider  allopurinol (ZYLOPRIM) 100 MG tablet Take 100 mg by mouth daily.    [provider]  buPROPion (WELLBUTRIN XL) 300 MG 24 hr tablet Take 300 mg by mouth daily. 06/13/18   [provider]  CARESTART COVID-19 HOME TEST KIT See admin instructions. 08/08/21   [provider]  cariprazine (VRAYLAR) capsule Take 3 mg by mouth daily.     [provider]  folic acid (FOLVITE) 482 MCG tablet Take 400 mcg by mouth daily. Taking 456m    [provider]  furosemide (LASIX) 40 MG tablet Take 1 tablet (40 mg total) by mouth daily. Patient taking differently: Take 60 mg by mouth daily. For 3 days 05/18/21   CDeberah Pelton NP  gabapentin (NEURONTIN) 800 MG tablet Take 1 tablet by mouth every 12 (twelve) hours. 04/10/21   [provider]  metoprolol succinate (TOPROL-XL) 50 MG 24 hr tablet TAKE 1 TABLET (50 MG  TOTAL) BY MOUTH 2 (TWO) TIMES DAILY. TAKE WITH OR IMMEDIATELY FOLLOWING A MEAL. 03/01/22   PFreada Bergeron MD  nitroGLYCERIN (NITROSTAT) 0.4 MG SL tablet Place 1 tablet (0.4 mg total) under the tongue every 5 (five) minutes as needed for chest pain. 05/23/16   BLeanor Kail PA  Oxycodone HCl 10 MG TABS Take 10 mg by mouth. 08/08/21   [provider]  potassium chloride SA (KLOR-CON M) 10 MEQ tablet Take 1 tablet (10 mEq total) by mouth 2 (two) times daily. 01/09/22 01/10/23  DMarylu Lund, NP  spironolactone (ALDACTONE) 25 MG tablet Take 0.5 tablets (12.5 mg total) by mouth daily. 01/03/22   PFreada Bergeron MD  tiZANidine (ZANAFLEX) 4 MG tablet Take 4 mg by mouth every 8 (eight) hours as needed for muscle spasms. 08/26/19   [provider]  VITAMIN E PO Take 1 capsule by mouth in the morning and at bedtime.    [provider]      Allergies    Codeine    Review of Systems   Review of Systems  All other systems reviewed and are negative.   Physical Exam Updated Vital Signs There were no vitals taken for this visit. Physical Exam Vitals and nursing note reviewed.  Constitutional:      General: She is not in acute distress.    Appearance: She is well-developed.     Comments: Unresponsive, CPR in progress,  KEdison Pace  tube in place with easy ventilations   IO in right lower leg  HENT:     Head: Normocephalic and atraumatic.  Eyes:     Conjunctiva/sclera: Conjunctivae normal.     Pupils: Pupils are equal, round, and reactive to light.  Cardiovascular:     Comments: PEA rhythm noted on monitor, no pulse Pulmonary:     Effort: No respiratory distress.     Comments: No spontaneous breathing attempt   King tube in place with equal breath sounds bilaterally with ventilation Abdominal:     General: There is no distension.     Palpations: Abdomen is soft.     Tenderness: There is no abdominal tenderness.  Musculoskeletal:        General: No  deformity. Normal range of motion.     Cervical back: Normal range of motion and neck supple.  Skin:    General: Skin is warm and dry.     ED Results / Procedures / Treatments   Labs (all labs ordered are listed, but only abnormal results are displayed) Labs Reviewed  CBG MONITORING, ED - Abnormal; Notable for the following components:      Result Value   Glucose-Capillary 141 (*)    All other components within normal limits    EKG None  Radiology No results found.  Procedures Procedures    Medications Ordered in ED Medications  EPINEPHrine (ADRENALIN) 1 MG/10ML injection (1 mg Intravenous Given 2022/03/31 1057)  sodium bicarbonate injection (50 mEq Intravenous Given March 31, 2022 1054)  EPINEPHrine (ADRENALIN) 1 MG/10ML injection (1 mg Intravenous Given 2022-03-31 1100)    ED Course/ Medical Decision Making/ A&P                           Medical Decision Making Risk Prescription drug management.    Medical Screen Complete  This patient presented to the ED with complaint of cardiac arrest.  This complaint involves an extensive number of treatment options. The initial differential diagnosis includes, but is not limited to, cardiac arrest  This presentation is: Acute, Self-Limited, Previously Undiagnosed, Uncertain Prognosis, Complicated, Systemic Symptoms, and Threat to Life/Bodily Function  Patient with extensive history including stroke, CHF, gastric bypass, diabetes, SVT presents after witnessed collapse and cardiac arrest.  Patient noted to be in PEA arrest with EMS.  No ROSC obtained with 25+ minutes of CPR attempted in the field. Multiple doses of epinephrine given prior to arrival  Patient completed another roughly 20 minutes of CPR in the ED without any improvement.  Patient remains in PEA.  Patient pronounced at 1103.  Of note, patient diagnosed with PE in the setting of Covid infection approximately 1 year ago.  Patient was on anticoagulation for 6 months.   Patient stopped Eliquis in July 2023.  Patient's husband and daughter made aware of outcome of case. All questions answered. Both Chaplain and Security present during family notification.  Case discussed briefly with Dr. Gemma Payor covering for [facility].  Patient's PCP is Dr. Ted Mcalpine [phone]).  Per Dr. Gemma Payor, death certificate to be sent to DR. VARADARAJAN at [facility].       Additional history obtained:  Additional history obtained from EMS External records from outside sources obtained and reviewed including prior ED visits and prior Inpatient records.   Cardiac Monitoring:  The patient was maintained on a cardiac monitor.  I personally viewed and interpreted the cardiac monitor which showed an underlying rhythm of: PEA   Medicines ordered:  I ordered medication including as per ACLS protocol   Reevaluation of the patient after these medicines showed that the patient: stayed the same  Problem List / ED Course:  Cardiac arrest   Reevaluation:  After the interventions noted above, I reevaluated the patient and found that they have: stayed the same   Disposition:  Patient expired. Death Certificate to be signed by PCP. Family understands course of care and outcome.    CRITICAL CARE Performed by: Valarie Merino   Total critical care time: 30 minutes  Critical care time was exclusive of separately billable procedures and treating other patients.  Critical care was necessary to treat or prevent imminent or life-threatening deterioration.  Critical care was time spent personally by me on the following activities: development of treatment plan with patient and/or surrogate as well as nursing, discussions with consultants, evaluation of patient's response to treatment, examination of patient, obtaining history from patient or surrogate, ordering and performing treatments and interventions, ordering and review of laboratory  studies, ordering and review of radiographic studies, pulse oximetry and re-evaluation of patient's condition.         Final Clinical Impression(s) / ED Diagnoses Final diagnoses:  Cardiac arrest [facility])    Rx / DC Orders ED Discharge Orders     None         Valarie Merino, MD March 16, 2022 1152    Valarie Merino, MD 2022-03-16 1214    Valarie Merino, MD 03/16/22 1332

## 2022-04-01 NOTE — Code Documentation (Signed)
CBG - 141 ° °

## 2022-04-01 NOTE — Progress Notes (Signed)
   03-22-22 1100  Clinical Encounter Type  Visited With Family;Patient not available  Visit Type ED;Death  Referral From Physician  Consult/Referral To Chaplain  Recommendations Address Grief  Spiritual Encounters  Spiritual Needs Sacred text;Prayer;Ritual;Emotional;Grief support  Stress Factors  Patient Stress Factors None identified  Family Stress Factors Loss;Loss of control;Major life changes   Chaplain met with family of patient in ED 100.  Provided spiritual care and comfort to family as they met with ED MD.  Will continue to provide support for family as needed.    Rev. Santiago Glad Huddelson-Ayeisha Lindenberger

## 2022-04-01 NOTE — ED Triage Notes (Addendum)
Patient arrived by [facility] from home with CPR IN progress. Per family patient was showering and had syncopal event. Following this patient became SOB and then EMS reports unresponsive and pulseless.  Arrived with IO in place, received EPI x 5 PTA, and King airway. See Code narrator for Code specifics here  Death pronounced at 11:03 by Rodena Medin MD

## 2022-04-01 DEATH — deceased

## 2022-04-29 ENCOUNTER — Other Ambulatory Visit: Payer: Medicare Other

## 2022-12-30 IMAGING — MG MM DIGITAL SCREENING BILAT W/ TOMO AND CAD
8 of 15 series · 8 of 40 positions shown · non-contrast
Comparison: Previous exam(s).

CLINICAL DATA: Screening.

EXAM:
DIGITAL SCREENING BILATERAL MAMMOGRAM WITH TOMOSYNTHESIS AND CAD
TECHNIQUE: Bilateral screening digital craniocaudal and mediolateral oblique
mammograms were obtained. Bilateral screening digital breast
tomosynthesis was performed. The images were evaluated with
computer-aided detection.

[R MLO synth-2D (1 of 2)]
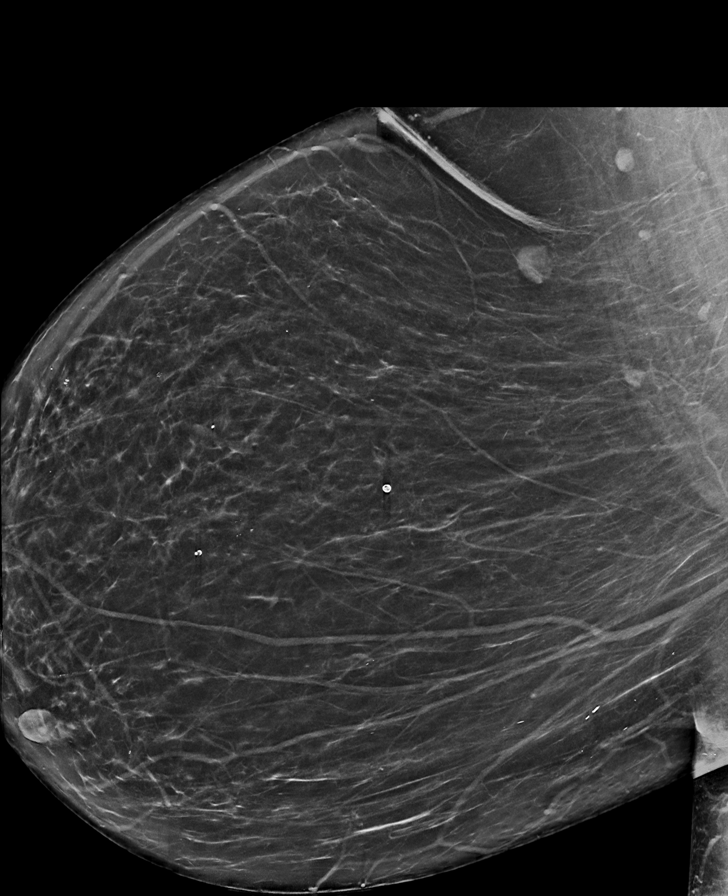

[L MLO synth-2D (1 of 2)]
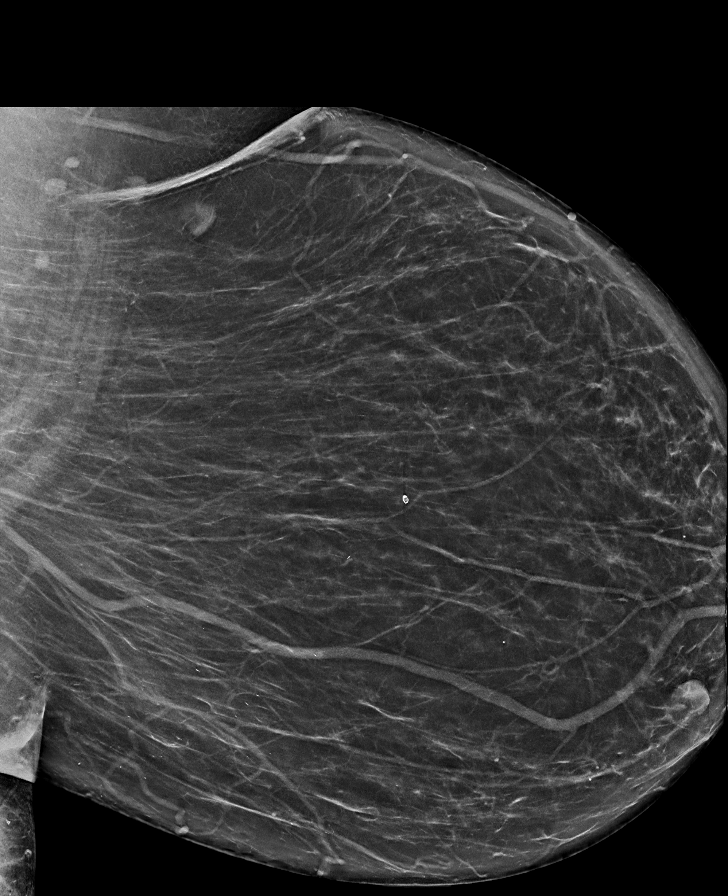

[R CC synth-2D (1 of 2)]
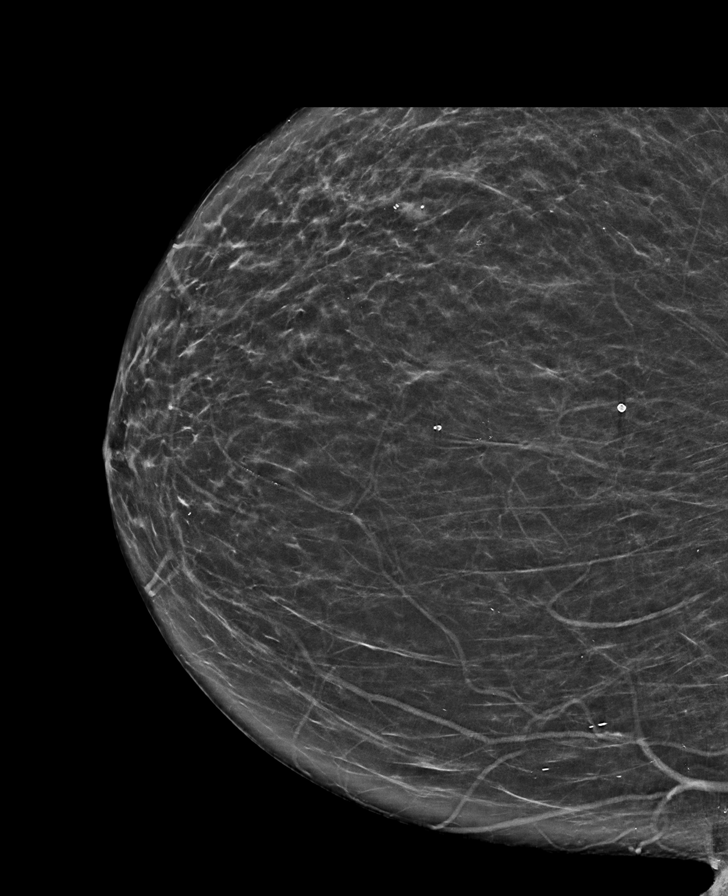

[L MLO synth-2D (2 of 2)]
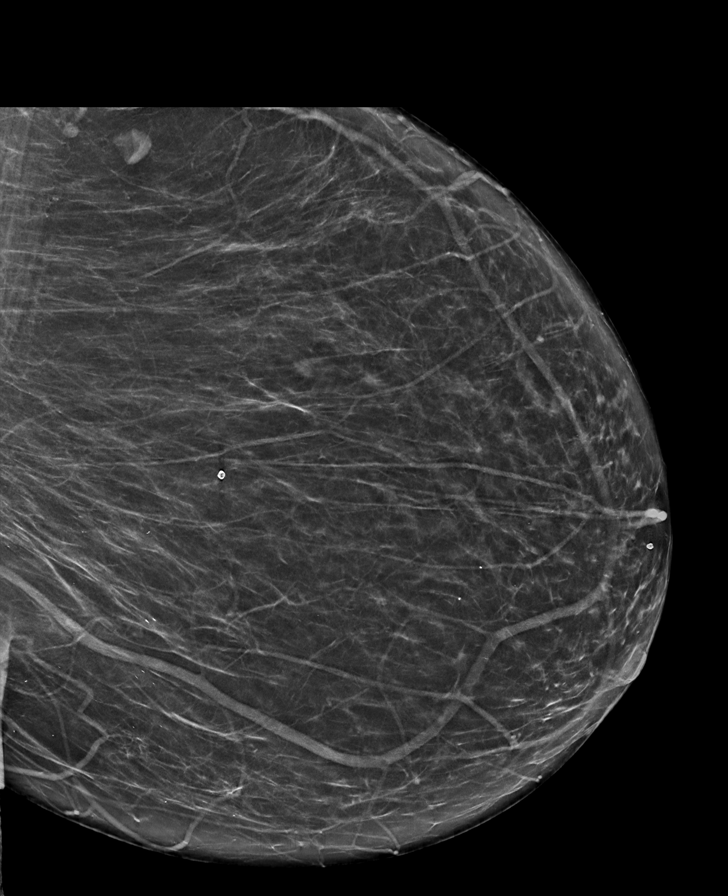

[L CC synth-2D]
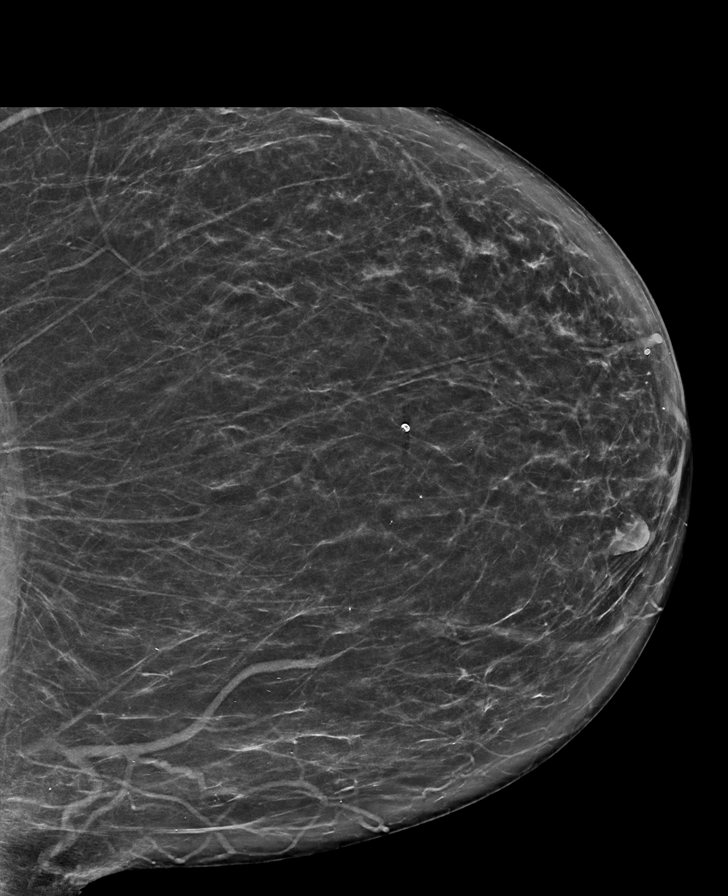

[R CC synth-2D (2 of 2)]
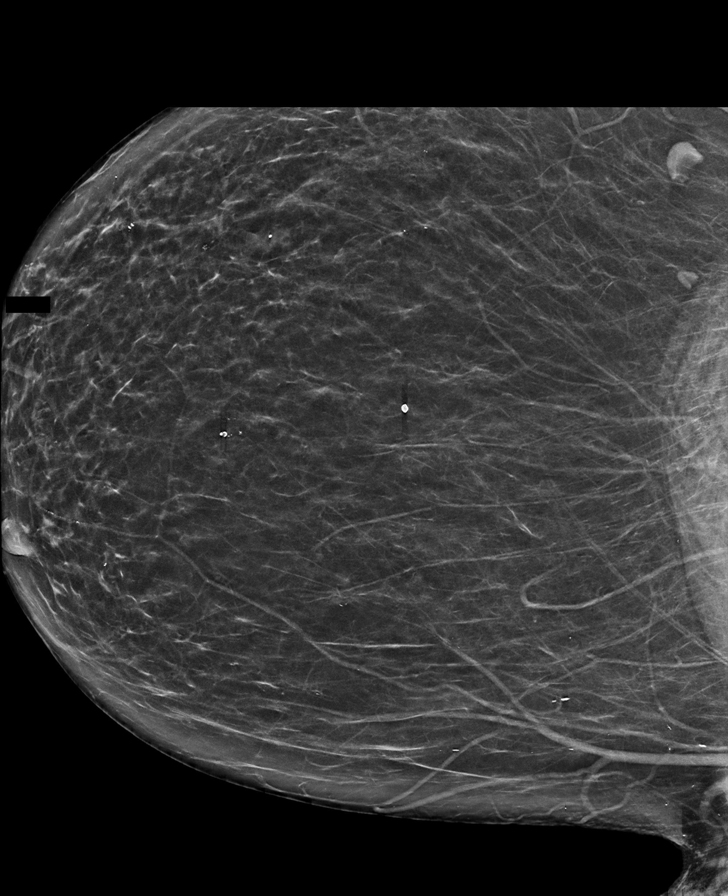

[R MLO synth-2D (2 of 2)]
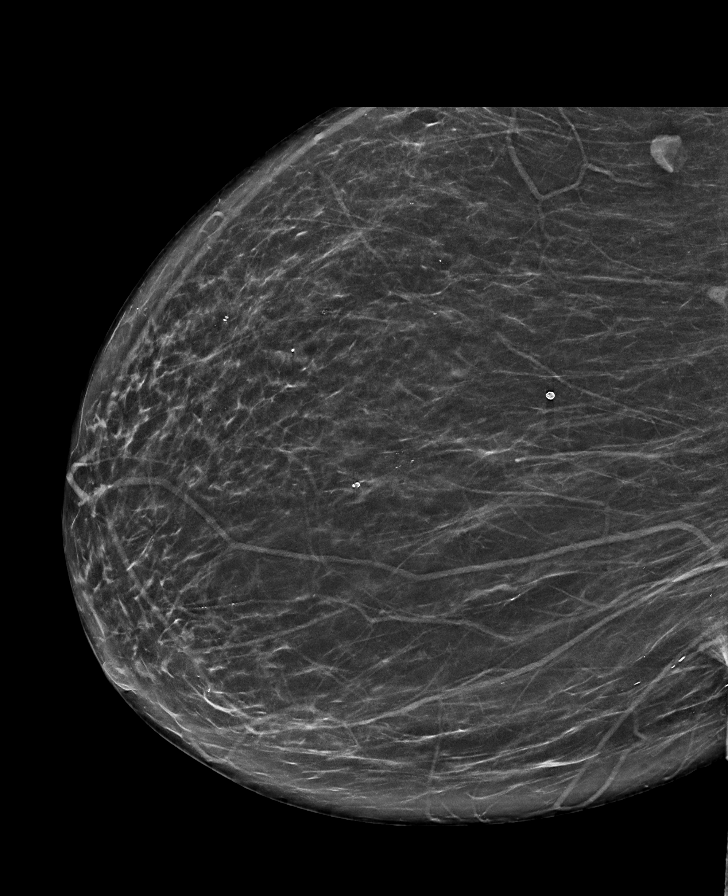

[R MLO tomo · tomo slice 54/79.0]
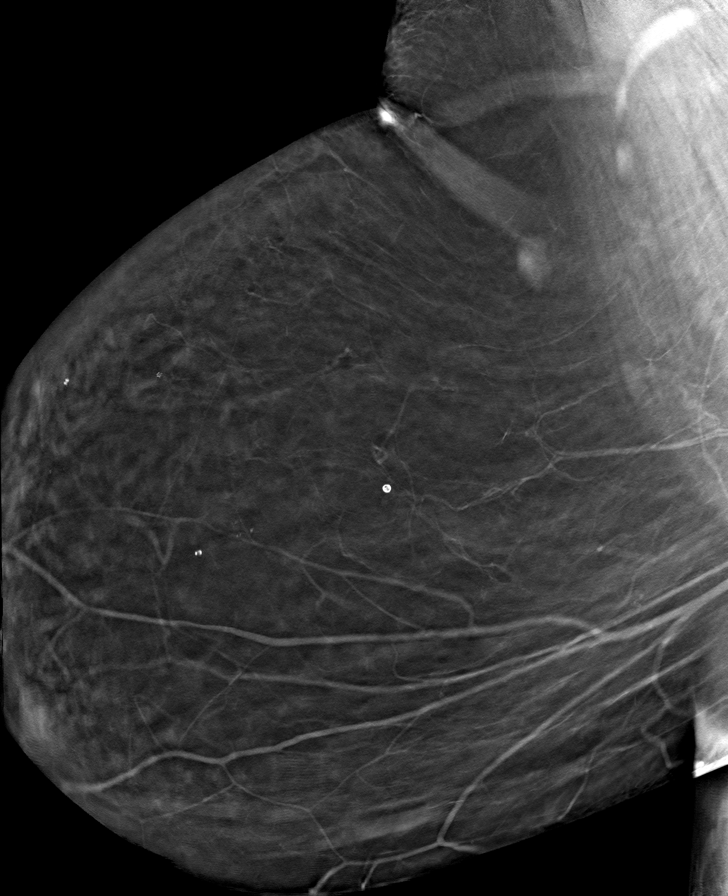

[8 of 40 positions shown; findings below may reference images not displayed]

ACR Breast Density Category b: There are scattered areas of
fibroglandular density.
FINDINGS: There are no findings suspicious for malignancy.
IMPRESSION: No mammographic evidence of malignancy. A result letter of this
screening mammogram will be mailed directly to the patient.

RECOMMENDATION:
Screening mammogram in one year. (Code:51-O-LD2)

BI-RADS CATEGORY  1: Negative.
